# Patient Record
Sex: Male | Born: 2000 | Race: Black or African American | Hispanic: No | Marital: Single | Smoking: Never smoker
Health system: Southern US, Community
[De-identification: ages and names within clinical notes are randomized; demographics above are authoritative.]

## PROBLEM LIST (undated history)

## (undated) DIAGNOSIS — L709 Acne, unspecified: Secondary | ICD-10-CM

## (undated) DIAGNOSIS — J45990 Exercise induced bronchospasm: Secondary | ICD-10-CM

## (undated) HISTORY — PX: MOUTH SURGERY: SHX715

## (undated) HISTORY — DX: Acne, unspecified: L70.9

---
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
## 2019-07-13 ENCOUNTER — Ambulatory Visit: Payer: 59 | Attending: Internal Medicine

## 2019-07-13 DIAGNOSIS — Z23 Encounter for immunization: Secondary | ICD-10-CM

## 2019-07-13 NOTE — Progress Notes (Signed)
   Covid-19 Vaccination Clinic  Name:  Justin Stewart    MRN: [id number] DOB: 2000/05/15  07/13/2019  [name] was observed post Covid-19 immunization for 15 minutes without incident. He was provided with Vaccine Information Sheet and instruction to access the V-Safe system.   [name] was instructed to call 911 with any severe reactions post vaccine: Marland Kitchen Difficulty breathing  . Swelling of face and throat  . A fast heartbeat  . A bad rash all over body  . Dizziness and weakness   Immunizations Administered    Name Date Dose VIS Date Route   Pfizer COVID-19 Vaccine 07/13/2019 12:28 PM 0.3 mL 03/25/2019 Intramuscular   Manufacturer: [place]   Lot: [id number]   [place]: [id number]
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
## 2019-08-07 ENCOUNTER — Ambulatory Visit: Payer: 59 | Attending: Internal Medicine

## 2019-08-07 DIAGNOSIS — Z23 Encounter for immunization: Secondary | ICD-10-CM

## 2019-08-07 NOTE — Progress Notes (Signed)
   Covid-19 Vaccination Clinic  Name:  Justin Stewart    MRN: [id number] DOB: 04-29-2000  08/07/2019  [name] was observed post Covid-19 immunization for 15 minutes without incident. He was provided with Vaccine Information Sheet and instruction to access the V-Safe system.   [name] was instructed to call 911 with any severe reactions post vaccine: Marland Kitchen Difficulty breathing  . Swelling of face and throat  . A fast heartbeat  . A bad rash all over body  . Dizziness and weakness   Immunizations Administered    Name Date Dose VIS Date Route   Pfizer COVID-19 Vaccine 08/07/2019  9:28 AM 0.3 mL 06/08/2018 Intramuscular   Manufacturer: [employer]   Lot: [id number]   [place]: [id number]

## 2019-09-09 ENCOUNTER — Other Ambulatory Visit: Payer: Self-pay

## 2019-09-09 ENCOUNTER — Ambulatory Visit (INDEPENDENT_AMBULATORY_CARE_PROVIDER_SITE_OTHER): Payer: 59 | Admitting: Family Medicine

## 2019-09-09 ENCOUNTER — Ambulatory Visit: Payer: 59 | Admitting: Family Medicine

## 2019-09-09 ENCOUNTER — Encounter: Payer: Self-pay | Admitting: Family Medicine

## 2019-09-09 DIAGNOSIS — T7840XA Allergy, unspecified, initial encounter: Secondary | ICD-10-CM | POA: Diagnosis not present

## 2019-09-09 DIAGNOSIS — R21 Rash and other nonspecific skin eruption: Secondary | ICD-10-CM

## 2019-09-09 MED ORDER — PREDNISONE 20 MG PO TABS: ORAL_TABLET | ORAL | 0 refills | Status: DC

## 2019-09-09 NOTE — Progress Notes (Signed)
Chief Complaint  Patient presents with  . Allergic Reaction    ?Rx to Doxycycline-Stopped it 2 to 3 weeks but still has whelps  . Back Pain    Sharp pain once today while walking today    History of Present Illness: HPI    19 year old male presents with reaction to doxycycline for infected left stye.  Took doxy x 10 days.. noted rash like hive off and on.. started 1 week later.  Rash appears intermittently on side, arms and leg. Face occ puffy.   No tounge, no lip swelling, no SOB.   Benadryl helps with itching mainly.  He reports bath and body works soap and lotion.Marland Kitchen stop using this. No new foods lately.  COVID vaccine 19.. 07/2019    This visit occurred during the SARS-CoV-2 public health emergency.  Safety protocols were in place, including screening questions prior to the visit, additional usage of staff PPE, and extensive cleaning of exam room while observing appropriate contact time as indicated for disinfecting solutions.   COVID 19 screen:  No recent travel or known exposure to COVID19 The patient denies respiratory symptoms of COVID 19 at this time. The importance of social distancing was discussed today.     Review of Systems  Constitutional: Negative for chills and fever.  HENT: Negative for congestion and ear pain.   Eyes: Negative for pain and redness.  Respiratory: Negative for cough and shortness of breath.   Cardiovascular: Negative for chest pain, palpitations and leg swelling.  Gastrointestinal: Negative for abdominal pain, blood in stool, constipation, diarrhea, nausea and vomiting.  Genitourinary: Negative for dysuria.  Musculoskeletal: Negative for falls and myalgias.  Skin: Negative for rash.  Neurological: Negative for dizziness.  Psychiatric/Behavioral: Negative for depression. The patient is not nervous/anxious.       Past Medical History:  Diagnosis Date  . Acne     reports that he has never smoked. He has never used smokeless tobacco.  He reports that he does not drink alcohol or use drugs.  No current outpatient medications on file.   Observations/Objective: Blood pressure 90/60, pulse 63, temperature 98.4 F (36.9 C), temperature source Temporal, height 5\' 8"  (1.727 m), weight 153 lb 8 oz (69.6 kg), SpO2 96 %.  Physical Exam Constitutional:      Appearance: He is well-developed.  HENT:     Head: Normocephalic.     Right Ear: Hearing normal.     Left Ear: Hearing normal.     Nose: Nose normal.  Neck:     Thyroid: No thyroid mass or thyromegaly.     Vascular: No carotid bruit.     Trachea: Trachea normal.  Cardiovascular:     Rate and Rhythm: Normal rate and regular rhythm.     Pulses: Normal pulses.     Heart sounds: Heart sounds not distant. No murmur. No friction rub. No gallop.      Comments: No peripheral edema Pulmonary:     Effort: Pulmonary effort is normal. No respiratory distress.     Breath sounds: Normal breath sounds.  Skin:    General: Skin is warm and dry.     Findings: No rash.  Psychiatric:        Speech: Speech normal.        Behavior: Behavior normal.        Thought Content: Thought content normal.      Assessment and Plan   Allergic reaction Not clearly secondary to doxycycline. Unclear cause. Treat with prednisone  taper and antihistamine.  If continues to be recurrent consider keeping diary and allergy referral.     Eliezer Lofts, MD

## 2019-09-09 NOTE — Assessment & Plan Note (Signed)
Not clearly secondary to doxycycline. Unclear cause. Treat with prednisone taper and antihistamine.  If continues to be recurrent consider keeping diary and allergy referral.

## 2019-09-09 NOTE — Patient Instructions (Signed)
Start zyrtec at bedtime.  Start prednisone taper.  Go to ER if shortness of breath.  Call if not improvingin few weeks.

## 2020-05-02 ENCOUNTER — Other Ambulatory Visit: Payer: Self-pay | Admitting: Family Medicine

## 2020-05-02 ENCOUNTER — Other Ambulatory Visit (INDEPENDENT_AMBULATORY_CARE_PROVIDER_SITE_OTHER): Payer: 59

## 2020-05-02 ENCOUNTER — Encounter: Payer: Self-pay | Admitting: Family Medicine

## 2020-05-02 ENCOUNTER — Other Ambulatory Visit: Payer: Self-pay

## 2020-05-02 ENCOUNTER — Telehealth (INDEPENDENT_AMBULATORY_CARE_PROVIDER_SITE_OTHER): Payer: 59 | Admitting: Family Medicine

## 2020-05-02 DIAGNOSIS — J069 Acute upper respiratory infection, unspecified: Secondary | ICD-10-CM

## 2020-05-02 LAB — POC INFLUENZA A&B (BINAX/QUICKVUE)
Influenza A, POC: NEGATIVE
Influenza B, POC: NEGATIVE

## 2020-05-02 NOTE — Patient Instructions (Addendum)
Drink fluids and rest  The office will call you to set up swabs for covid and flu Nasal saline may help congestion  mucinex DM or robitussin dm for cough/congestion  Please continue to isolate until we get results   If worse please let us know  If severe or short of breath go to the ER

## 2020-05-02 NOTE — Progress Notes (Signed)
Virtual Visit via Video Note  I connected with Justin Stewart on 05/02/20 at 10:00 AM EST by a video enabled telemedicine application and verified that I am speaking with the correct person using two identifiers.  Location: Patient: home Provider: office   I discussed the limitations of evaluation and management by telemedicine and the availability of in person appointments. The patient expressed understanding and agreed to proceed.  Parties involved in encounter  Patient: Justin Stewart  Provider:  Loura Pardon MD   Video failed today  History of Present Illness  Pt presents for acute illness with n/v and congestion and ST 20 yo pt of Dr Justin Stewart  Lost his voice to start Sunday  Then prod cough -phlegm is green to clear  Vomited twice in one day (possibly from cough)  No diarrhea or abd pain  Yesterday had a fever (chills) -that improved overnight  Forgot the temp  Some nasal congestion   Some headache when he coughs (forehead)  Throat- hurt /now better  Ears =fine   No sick contacts  He is in school    Immunized for covid pfizer in April (no booster)  No flu shot  Had covid 8/20  Has not tested for covid during this illness   Otc:  Tylenol Alka selzer  Vit C packets  Drinking fluids   Patient Active Problem List   Diagnosis Date Noted  . Viral URI with cough 05/02/2020  . Allergic reaction 09/09/2019  . Other insomnia 08/09/2018  . Acute pain of left knee 08/09/2018  . 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 Acne    Past Surgical History:  Procedure Laterality Date  . MOUTH SURGERY     Social History   Tobacco Use  . Smoking status: Never Smoker  . Smokeless tobacco: Never Used  Vaping Use  . Vaping Use: Never used  Substance Use Topics  . Alcohol use: Never  . Drug use: Never   Family History  Problem Relation Age of Onset  . Hypertension Mother   . Iron deficiency Mother   . Deep vein thrombosis Father   .  Healthy Sister   . Healthy Brother   . Diabetes Maternal Grandfather   . Hypertension Maternal Grandfather   . Stroke Maternal Grandmother 60   No Known Allergies No current outpatient medications on file prior to visit.   No current facility-administered medications on file prior to visit.   Review of Systems  Constitutional: Positive for fever and malaise/fatigue. Negative for chills.  HENT: Positive for congestion. Negative for ear pain, sinus pain and sore throat.        ST is better  Eyes: Negative for blurred vision, discharge and redness.  Respiratory: Positive for cough and sputum production. Negative for shortness of breath, wheezing and stridor.   Cardiovascular: Negative for chest pain, palpitations and leg swelling.  Gastrointestinal: Negative for abdominal pain, diarrhea, nausea and vomiting.  Musculoskeletal: Negative for myalgias.  Skin: Negative for rash.  Neurological: Positive for headaches. Negative for dizziness.    Observations/Objective: Pt sounds hoarse Not distressed Talks slowly  Nl cognition Mood is nl  No cough during interview and does not sound sob  Assessment and Plan: Problem List Items Addressed This Visit      Respiratory   Viral URI with cough    Since Sunday in covid immunized pt (without booster)  Some fever and also vomiting one day  covid and flu swabs ordered for  drive through  Continue tylenol Fluids /avoid dehydration  Suggested nasal saline and mucinex dm or robitussin dm for cough/congestion ER parameters rev (sob, s/s of dehydration)  inst to isolate until results are back           Follow Up Instructions:  Drink fluids and rest  The office will call you to set up swabs for covid and flu Nasal saline may help congestion  mucinex DM or robitussin dm for cough/congestion  Please continue to isolate until we get results   If worse please let us know  If severe or short of breath go to the ER     I discussed the  assessment and treatment plan with the patient. The patient was provided an opportunity to ask questions and all were answered. The patient agreed with the plan and demonstrated an understanding of the instructions.   The patient was advised to call back or seek an in-person evaluation if the symptoms worsen or if the condition fails to improve as anticipated.  I provided 17 minutes of non-face-to-face time during this encounter.   Loura Pardon, MD

## 2020-05-02 NOTE — Assessment & Plan Note (Signed)
Since Sunday in covid immunized pt (without booster)  Some fever and also vomiting one day  covid and flu swabs ordered for drive through  Continue tylenol Fluids /avoid dehydration  Suggested nasal saline and mucinex dm or robitussin dm for cough/congestion ER parameters rev (sob, s/s of dehydration)  inst to isolate until results are back

## 2020-05-04 LAB — SPECIMEN STATUS REPORT

## 2020-05-04 LAB — SARS-COV-2, NAA 2 DAY TAT

## 2020-05-04 LAB — NOVEL CORONAVIRUS, NAA: SARS-CoV-2, NAA: NOT DETECTED

## 2020-05-07 ENCOUNTER — Telehealth: Payer: Self-pay

## 2020-05-07 ENCOUNTER — Other Ambulatory Visit: Payer: Self-pay | Admitting: Family Medicine

## 2020-05-07 ENCOUNTER — Other Ambulatory Visit (INDEPENDENT_AMBULATORY_CARE_PROVIDER_SITE_OTHER): Payer: 59

## 2020-05-07 DIAGNOSIS — J029 Acute pharyngitis, unspecified: Secondary | ICD-10-CM | POA: Diagnosis not present

## 2020-05-07 LAB — POCT RAPID STREP A (OFFICE): Rapid Strep A Screen: NEGATIVE

## 2020-05-07 NOTE — Telephone Encounter (Signed)
Justin Stewart's mother is calling in stating seen Dr.Tower last week. Mom states symptoms are not improving and covid and strep test were negative, wants to know if they can do another strep test along with a mono test?

## 2020-05-07 NOTE — Telephone Encounter (Signed)
Spoke with mother and pt's main sxs is his throat is even more sore then before, and feels swollen. Pt also saw white patches in the back of his throat this morning. Mother said she is concern maily for strep but wanted to be on the safe side and get him tested for mono also. Lab appt scheduled this afternoon, please put orders in

## 2020-05-07 NOTE — Telephone Encounter (Signed)
Tests ordered 

## 2020-05-07 NOTE — Telephone Encounter (Signed)
Sure What symptoms are worse/what are the same and what symptoms (if any) have improved?   Let me know and I will put orders in then

## 2020-05-07 NOTE — Telephone Encounter (Signed)
Will defer to Dr. Glori Bickers since she evaluated him.

## 2020-05-08 LAB — MONONUCLEOSIS SCREEN: Mono Screen: NEGATIVE

## 2020-05-09 ENCOUNTER — Other Ambulatory Visit: Payer: Self-pay

## 2020-05-09 ENCOUNTER — Encounter: Payer: Self-pay | Admitting: Family Medicine

## 2020-05-09 ENCOUNTER — Ambulatory Visit: Payer: 59 | Admitting: Family Medicine

## 2020-05-09 VITALS — BP 122/70 | HR 101 | Temp 99.9°F | Ht 68.0 in | Wt 160.0 lb

## 2020-05-09 DIAGNOSIS — J029 Acute pharyngitis, unspecified: Secondary | ICD-10-CM | POA: Diagnosis not present

## 2020-05-09 DIAGNOSIS — J069 Acute upper respiratory infection, unspecified: Secondary | ICD-10-CM | POA: Diagnosis not present

## 2020-05-09 MED ORDER — CEFTRIAXONE SODIUM 1 G IJ SOLR
1.0000 g | Freq: Once | INTRAMUSCULAR | Status: AC
  Administered 2020-05-09: 1 g via INTRAMUSCULAR

## 2020-05-09 MED ORDER — AMOXICILLIN-POT CLAVULANATE 875-125 MG PO TABS: 1.0000 | ORAL_TABLET | Freq: Two times a day (BID) | ORAL | 0 refills | Status: DC

## 2020-05-09 NOTE — Assessment & Plan Note (Addendum)
S/p viral syndrome -now primarily throat with tonsillitis Throat cx obt and well as swab for gonorrhea  Strep swab neg times 2 and mono test neg  Able to swallow with discomfort  Disc symptom control, nsaid/analgesic/fluids /ice  If unable to swallow or sob inst to go to ER Rest  Rocephin 1g given today  augmentin sent to pharmacy  Meds ordered this encounter  Medications  . amoxicillin-clavulanate (AUGMENTIN) 875-125 MG tablet    Sig: Take 1 tablet by mouth 2 (two) times daily.    Dispense:  14 tablet    Refill:  0  . cefTRIAXone (ROCEPHIN) injection 1 g   inst to call if no imp in several days

## 2020-05-09 NOTE — Progress Notes (Signed)
Subjective:    Patient ID: Justin Stewart, male    DOB: June 13, 2000, 20 y.o.   MRN: [id number]  This visit occurred during the SARS-CoV-2 public health emergency.  Safety protocols were in place, including screening questions prior to the visit, additional usage of staff PPE, and extensive cleaning of exam room while observing appropriate contact time as indicated for disinfecting solutions.    HPI  Pt presents with ST /white patches in throat   Wt Readings from Last 3 Encounters:  05/09/20 160 lb (72.6 kg)  09/09/19 153 lb 8 oz (69.6 kg)  11/25/18 150 lb (68 kg) (44 %, Z= -0.15)*   * Growth percentiles are based on CDC (Boys, 2-20 Years) data.   24.33 kg/m   Was seen 11/19 for uri symptoms Has had 2 neg covid tests 2 neg strep tests  Neg flu test  One neg mono test   Sore throat - painful to swallow  Some headache  Back hurts but thinks it was from bed  Fever -up and down   Drinking some ginger ale and some water  Some broth last night    Never smoker   Not a lot of nasal congestion /a little  Some cough here and there  Gagging but no vomiting  No diarrhea   No rash anywhere   No apthous ulcers that he knows of   Is sexually active  No exposure    otc ny quil  Salt water gargle   Patient Active Problem List   Diagnosis Date Noted  . Acute pharyngitis 05/09/2020  . Sore throat 05/07/2020  . Viral URI with cough 05/02/2020  . Allergic reaction 09/09/2019  . Other insomnia 08/09/2018  . Acute pain of left knee 08/09/2018  . 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 Acne    Past Surgical History:  Procedure Laterality Date  . MOUTH SURGERY     Social History   Tobacco Use  . Smoking status: Never Smoker  . Smokeless tobacco: Never Used  Vaping Use  . Vaping Use: Never used  Substance Use Topics  . Alcohol use: Never  . Drug use: Never   Family History  Problem Relation Age of Onset  . Hypertension Mother    . Iron deficiency Mother   . Deep vein thrombosis Father   . Healthy Sister   . Healthy Brother   . Diabetes Maternal Grandfather   . Hypertension Maternal Grandfather   . Stroke Maternal Grandmother 60   No Known Allergies No current outpatient medications on file prior to visit.   No current facility-administered medications on file prior to visit.    Review of Systems  Constitutional: Positive for fatigue and fever. Negative for activity change, appetite change and unexpected weight change.  HENT: Positive for postnasal drip, rhinorrhea, sore throat, trouble swallowing and voice change. Negative for congestion and sinus pain.   Eyes: Negative for pain, redness, itching and visual disturbance.  Respiratory: Positive for cough. Negative for chest tightness, shortness of breath and wheezing.   Cardiovascular: Negative for chest pain and palpitations.  Gastrointestinal: Negative for abdominal pain, blood in stool, constipation, diarrhea and nausea.  Endocrine: Negative for cold intolerance, heat intolerance, polydipsia and polyuria.  Genitourinary: Negative for difficulty urinating, dysuria, frequency and urgency.  Musculoskeletal: Negative for arthralgias, joint swelling and myalgias.  Skin: Negative for pallor and rash.  Neurological: Positive for headaches. Negative for dizziness, tremors, weakness and numbness.  Hematological: Negative  for adenopathy. Does not bruise/bleed easily.  Psychiatric/Behavioral: Negative for decreased concentration and dysphoric mood. The patient is not nervous/anxious.        Objective:   Physical Exam Constitutional:      General: He is not in acute distress.    Appearance: Normal appearance. He is normal weight. He is not ill-appearing.     Comments: fatigued  HENT:     Head: Normocephalic and atraumatic.     Right Ear: Tympanic membrane, ear canal and external ear normal.     Left Ear: Tympanic membrane, ear canal and external ear normal.      Nose: Nose normal.     Mouth/Throat:     Mouth: Mucous membranes are moist.     Pharynx: Oropharyngeal exudate and posterior oropharyngeal erythema present.     Comments: Moderately swollen tonsils with erythema and exudate  No ulcers  No evidence of thrush Eyes:     General:        Right eye: No discharge.        Left eye: No discharge.     Conjunctiva/sclera: Conjunctivae normal.     Pupils: Pupils are equal, round, and reactive to light.  Cardiovascular:     Rate and Rhythm: Regular rhythm. Tachycardia present.     Heart sounds: Normal heart sounds.  Pulmonary:     Effort: Pulmonary effort is normal. No respiratory distress.     Breath sounds: Normal breath sounds. No wheezing or rales.  Musculoskeletal:     Cervical back: Normal range of motion and neck supple.  Lymphadenopathy:     Cervical: No cervical adenopathy.  Skin:    Findings: No erythema or rash.  Neurological:     Mental Status: He is alert.  Psychiatric:        Mood and Affect: Mood normal.           Assessment & Plan:   Problem List Items Addressed This Visit      Respiratory   Viral URI with cough    2 covid tests neg and flu test neg  For sore throat- neg strep swab times 2 and neg mono      Relevant Orders   Other/Misc lab test   Acute pharyngitis - Primary    S/p viral syndrome -now primarily throat with tonsillitis Throat cx obt and well as swab for gonorrhea  Strep swab neg times 2 and mono test neg  Able to swallow with discomfort  Disc symptom control, nsaid/analgesic/fluids /ice  If unable to swallow or sob inst to go to ER Rest  Rocephin 1g given today  augmentin sent to pharmacy  Meds ordered this encounter  Medications  . amoxicillin-clavulanate (AUGMENTIN) 875-125 MG tablet    Sig: Take 1 tablet by mouth 2 (two) times daily.    Dispense:  14 tablet    Refill:  0  . cefTRIAXone (ROCEPHIN) injection 1 g   inst to call if no imp in several days      Relevant Orders    Other/Misc lab test   Culture, Group A Strep     Other   Sore throat   Relevant Orders   Other/Misc lab test

## 2020-05-09 NOTE — Assessment & Plan Note (Signed)
2 covid tests neg and flu test neg  For sore throat- neg strep swab times 2 and neg mono

## 2020-05-09 NOTE — Patient Instructions (Signed)
Sip fluids  Ice/popcicles are good also  If unable to swallow or trouble breathing please alert [place] and go to the ER   Rocephin shot today  Then start augmentin tomorrow twice daily   Tylenol and /or ibuprofen for pain or fever

## 2020-05-14 LAB — CULTURE, GROUP A STREP
MICRO NUMBER:: 11459844
SPECIMEN QUALITY:: ADEQUATE

## 2020-05-14 LAB — NEISSERIA GONORRHOEAE RNA, TMA, THROAT: Neisseria gonorrhoeae RNA: NOT DETECTED

## 2020-05-15 ENCOUNTER — Telehealth: Payer: Self-pay | Admitting: Family Medicine

## 2020-05-15 NOTE — Telephone Encounter (Signed)
Pt notified of Dr. Tower's comments and verbalized understanding  

## 2020-05-15 NOTE — Telephone Encounter (Signed)
Note done If he fails to improve further or symptoms re occur I recommend follow up with his primary provider and from there ENT if needed.  I do not suspect he will need a tonsillectomy at this point.

## 2020-05-15 NOTE — Telephone Encounter (Signed)
Patient called stating he received a call from [place]. He is returning the call to [place]. EM

## 2020-05-15 NOTE — Telephone Encounter (Signed)
Pt notified of throat cx results. Pt said that the abx is helping, his ST is getting better. Pt needs a school note, he has been out from 05/07/20 and wants to return after finishing abx so his return date needs to be this Friday 05/18/20. Pt said if Dr. Glori Bickers put the school note on mychart he can just email it from his phone.   Pt also asked if he needs to get his tonsils removed if this happens again?

## 2020-06-13 ENCOUNTER — Telehealth: Payer: Self-pay

## 2020-06-13 NOTE — Telephone Encounter (Signed)
As new sore throat would recommend virtual visit. Anticipate he will need drive up testing for covid as well as consideration for additional testing.

## 2020-06-13 NOTE — Telephone Encounter (Signed)
Per Cardell Peach, RN, pt's father came into office inquiring if he could get an apt for the pt for tomorrow. They informed him the pt had an apt already for tomorrow and that it was a VV. He said that the pt needed to be seen and how is going to be assessed with a virtual visit. He then left the office. Shortly after he came back into the office and spoke with another front office staff member and verbalized he was upset that pt's apt tomorrow was  a VV when he was seen in the office for the same issue at the end of Jan 2022.  He was told by front office staff that it is protocol with COVID symptoms to have a VV. The pt's father was upset and walked out. He voiced his frustration but per the front office staff pt was not rude to any of them, just frustrated.   Management was not advised about the pt while he was in the office.

## 2020-06-13 NOTE — Telephone Encounter (Signed)
Spoke with patient and changed to a virtual appointment.

## 2020-06-13 NOTE — Telephone Encounter (Signed)
Patient called in a stated that about a month ago he was seen in office with Dr. Glori Bickers and was diagnosed with tonsillitis. Patient stated that after taking the antibiotics this improved, but his symptoms (sore throat) have returned. Patient denied SOB, chest pain, nasal congestion, cough, fever, N/V/D, loss of taste or smell. Patient has an appointment tomorrow with Dr. Einar Pheasant at 3:40. Instructed patient if he develops new or worsening symptoms to call back. UC and ED precautions given and patient verbalized understanding. Ok to see in office? Please advise.

## 2020-06-13 NOTE — Telephone Encounter (Signed)
Noted  

## 2020-06-14 ENCOUNTER — Other Ambulatory Visit: Payer: Self-pay

## 2020-06-14 ENCOUNTER — Other Ambulatory Visit (INDEPENDENT_AMBULATORY_CARE_PROVIDER_SITE_OTHER): Payer: 59 | Admitting: Family Medicine

## 2020-06-14 ENCOUNTER — Encounter: Payer: Self-pay | Admitting: Family Medicine

## 2020-06-14 ENCOUNTER — Telehealth (INDEPENDENT_AMBULATORY_CARE_PROVIDER_SITE_OTHER): Payer: 59 | Admitting: Family Medicine

## 2020-06-14 ENCOUNTER — Other Ambulatory Visit: Payer: 59

## 2020-06-14 VITALS — Wt 152.0 lb

## 2020-06-14 DIAGNOSIS — J039 Acute tonsillitis, unspecified: Secondary | ICD-10-CM | POA: Insufficient documentation

## 2020-06-14 DIAGNOSIS — J029 Acute pharyngitis, unspecified: Secondary | ICD-10-CM

## 2020-06-14 DIAGNOSIS — R21 Rash and other nonspecific skin eruption: Secondary | ICD-10-CM | POA: Insufficient documentation

## 2020-06-14 DIAGNOSIS — L309 Dermatitis, unspecified: Secondary | ICD-10-CM

## 2020-06-14 LAB — POCT RAPID STREP A (OFFICE): Rapid Strep A Screen: NEGATIVE

## 2020-06-14 MED ORDER — TRIAMCINOLONE ACETONIDE 0.1 % EX CREA: 1.0000 "application " | TOPICAL_CREAM | Freq: Two times a day (BID) | CUTANEOUS | 0 refills | Status: DC

## 2020-06-14 MED ORDER — AMOXICILLIN-POT CLAVULANATE 875-125 MG PO TABS: 1.0000 | ORAL_TABLET | Freq: Two times a day (BID) | ORAL | 0 refills | Status: DC

## 2020-06-14 NOTE — Assessment & Plan Note (Signed)
Most consistent with eczema. Trial of triamcinolone cream.

## 2020-06-14 NOTE — Assessment & Plan Note (Signed)
Reviewed prior work-up which was negative for rapid strept and culture but eventually responded to antibiotics. He notes recent covid-19 infection so will not test for this. Given hx and similar presentation will treat with abx but have him do rapid strept today. Discussed if symptoms recur that we will plan for ENT consult

## 2020-06-14 NOTE — Progress Notes (Signed)
I connected with Justin Stewart on 06/14/20 at  3:40 PM EST by video and verified that I am speaking with the correct person using two identifiers.   I discussed the limitations, risks, security and privacy concerns of performing an evaluation and management service by video and the availability of in person appointments. I also discussed with the patient that there may be a patient responsible charge related to this service. The patient expressed understanding and agreed to proceed.  Patient location: Home Provider Location: [place] Participants: [name] and Justin Stewart   Subjective:     Justin Stewart is a 20 y.o. male presenting for Sore Throat (With a puss pocket X 4 days)     HPI   #Sore Throat - symptoms started 4 days ago - friend was sick on Saturday  - symptoms start 06/11/2020 - no loss of taste or smell - hx of covid infection in 11/2018  Reports positive covid test within the 90 days  #Neck eczema  - using moisturizer - occasionally itchy - worse with heat from the shower - redness and stinging - started 2 weeks ago - started with one sp   Review of Systems  Constitutional: Negative for chills and fever.  HENT: Positive for rhinorrhea (on tuesday) and sore throat. Negative for congestion, ear discharge, ear pain and sneezing.   Respiratory: Negative for cough and shortness of breath.   Musculoskeletal: Positive for back pain. Negative for arthralgias and myalgias.  Neurological: Negative for headaches.    Review of Systems   Social History   Tobacco Use  Smoking Status Never Smoker  Smokeless Tobacco Never Used        Objective:   BP Readings from Last 3 Encounters:  05/09/20 122/70  09/09/19 90/60  11/25/18 92/60   Wt Readings from Last 3 Encounters:  06/14/20 152 lb (68.9 kg)  05/09/20 160 lb (72.6 kg)  09/09/19 153 lb 8 oz (69.6 kg)   Wt 152 lb (68.9 kg)   BMI 23.11 kg/m    Physical Exam Constitutional:       Appearance: Normal appearance. He is not ill-appearing.  HENT:     Head: Normocephalic and atraumatic.     Right Ear: External ear normal.     Left Ear: External ear normal.     Mouth/Throat:     Mouth: Oral lesions (right side ulcerative lesion) present.     Pharynx: Posterior oropharyngeal erythema present.     Tonsils: 2+ on the right. 2+ on the left.  Eyes:     Conjunctiva/sclera: Conjunctivae normal.  Pulmonary:     Effort: Pulmonary effort is normal. No respiratory distress.  Skin:    Comments: Posterior neck with papular and plaque erythematous patches.   Neurological:     Mental Status: He is alert. Mental status is at baseline.  Psychiatric:        Mood and Affect: Mood normal.        Behavior: Behavior normal.        Thought Content: Thought content normal.        Judgment: Judgment normal.             Assessment & Plan:   Problem List Items Addressed This Visit      Respiratory   Tonsillitis - Primary    Reviewed prior work-up which was negative for rapid strept and culture but eventually responded to antibiotics. He notes recent covid-19 infection so will not test for this.  Given hx and similar presentation will treat with abx but have him do rapid strept today. Discussed if symptoms recur that we will plan for ENT consult      Relevant Medications   amoxicillin-clavulanate (AUGMENTIN) 875-125 MG tablet     Musculoskeletal and Integument   Rash    Most consistent with eczema. Trial of triamcinolone cream.       Relevant Medications   triamcinolone (KENALOG) 0.1 %       Return if symptoms worsen or fail to improve.  [name], MD

## 2020-06-18 ENCOUNTER — Ambulatory Visit: Payer: 59 | Admitting: Family Medicine

## 2020-06-18 ENCOUNTER — Other Ambulatory Visit: Payer: Self-pay | Admitting: Family Medicine

## 2020-06-18 DIAGNOSIS — J039 Acute tonsillitis, unspecified: Secondary | ICD-10-CM

## 2020-06-18 NOTE — Progress Notes (Signed)
Spoke with pt's father this morning  Justin Stewart's symptoms of sore throat are worsening  2nd episode in ~1 month Previous work-up negative but responded to abx  On antibiotics with worsening symptoms. Could be viral, however, will refer to ENT for further evaluation given persistent symptoms

## 2020-06-18 NOTE — Addendum Note (Signed)
Addended by: Ellamae Sia on: 06/18/2020 04:27 PM   Modules accepted: Orders

## 2020-06-19 ENCOUNTER — Telehealth: Payer: Self-pay | Admitting: Family Medicine

## 2020-06-19 NOTE — Telephone Encounter (Signed)
Pt called in wanted to let Dr.Cody know that the medication is not working and his throat is still sore.

## 2020-06-19 NOTE — Telephone Encounter (Signed)
Spoke to pt and let him know that Dr. Einar Pheasant has sent in a referral to ENT. If pt wishes to be seen before getting in with ENT he can come into office since he has recently tested negative for covid and strep.

## 2020-06-21 ENCOUNTER — Encounter: Payer: Self-pay | Admitting: Family Medicine

## 2020-06-21 ENCOUNTER — Other Ambulatory Visit: Payer: Self-pay

## 2020-06-21 ENCOUNTER — Ambulatory Visit: Payer: 59 | Admitting: Family Medicine

## 2020-06-21 VITALS — BP 100/62 | HR 78 | Temp 98.8°F | Ht 68.0 in | Wt 164.2 lb

## 2020-06-21 DIAGNOSIS — J039 Acute tonsillitis, unspecified: Secondary | ICD-10-CM | POA: Diagnosis not present

## 2020-06-21 DIAGNOSIS — J36 Peritonsillar abscess: Secondary | ICD-10-CM | POA: Diagnosis not present

## 2020-06-21 MED ORDER — AMOXICILLIN-POT CLAVULANATE 875-125 MG PO TABS: 1.0000 | ORAL_TABLET | Freq: Two times a day (BID) | ORAL | 0 refills | Status: AC

## 2020-06-21 NOTE — Progress Notes (Signed)
Subjective:     Justin Stewart is a 20 y.o. male presenting for Sore Throat (Swelling and red )     HPI   #Sore throat - on last day of antibiotics - the initial pus pocket improved - now with new pus pockets and tonsil swellings - pain with swallowing - did have a fever Sunday-Tuesday    Review of Systems  HENT: Positive for sore throat. Negative for rhinorrhea.   Respiratory: Negative for cough and shortness of breath.      Social History   Tobacco Use  Smoking Status Never Smoker  Smokeless Tobacco Never Used        Objective:    BP Readings from Last 3 Encounters:  06/21/20 100/62  05/09/20 122/70  09/09/19 90/60   Wt Readings from Last 3 Encounters:  06/21/20 164 lb 4 oz (74.5 kg)  06/14/20 152 lb (68.9 kg)  05/09/20 160 lb (72.6 kg)    BP 100/62   Pulse 78   Temp 98.8 F (37.1 C) (Temporal)   Ht 5\' 8"  (1.727 m)   Wt 164 lb 4 oz (74.5 kg)   SpO2 98%   BMI 24.97 kg/m    Physical Exam Constitutional:      Appearance: Normal appearance. He is not ill-appearing or diaphoretic.  HENT:     Head: Normocephalic and atraumatic.     Right Ear: External ear normal.     Left Ear: External ear normal.     Nose: Nose normal.     Mouth/Throat:     Mouth: Mucous membranes are moist.     Tonsils: Tonsillar exudate present. 3+ on the right. 3+ on the left.     Comments: Uvula is deviate to the right. Right tonsil is swollen and forward.  Eyes:     General: No scleral icterus.    Extraocular Movements: Extraocular movements intact.     Conjunctiva/sclera: Conjunctivae normal.  Neck:     Comments: Tenderness and swelling along the right side the neck Cardiovascular:     Rate and Rhythm: Normal rate.  Pulmonary:     Effort: Pulmonary effort is normal.  Musculoskeletal:     Cervical back: Neck supple.  Lymphadenopathy:     Cervical: Cervical adenopathy present.  Skin:    General: Skin is warm and dry.  Neurological:     Mental Status: He is alert.  Mental status is at baseline.  Psychiatric:        Mood and Affect: Mood normal.           Assessment & Plan:   Problem List Items Addressed This Visit      Respiratory   Tonsillitis   Relevant Medications   amoxicillin-clavulanate (AUGMENTIN) 875-125 MG tablet   Peritonsillar abscess - Primary    Worsening sore throat and swelling after 1 week of abx with fevers which has resolved. Discussed with Dr. Pryor Ochoa concern for abscess and he agreed to evaluatethis afternoon. Pt advised to go Dr. Darien Ramus office for further treatment. Currently afebrile and tolerating PO so do not think ER is necessary with close ENT evaluation.        Relevant Medications   amoxicillin-clavulanate (AUGMENTIN) 875-125 MG tablet       Return if symptoms worsen or fail to improve.  [name], MD  This visit occurred during the SARS-CoV-2 public health emergency.  Safety protocols were in place, including screening questions prior to the visit, additional usage of staff PPE, and extensive cleaning of  exam room while observing appropriate contact time as indicated for disinfecting solutions.

## 2020-06-21 NOTE — Patient Instructions (Addendum)
Go to [facility] [address] [place], [place], [place] [address]

## 2020-06-21 NOTE — Assessment & Plan Note (Addendum)
Worsening sore throat and swelling after 1 week of abx with fevers which has resolved. Discussed with Dr. Pryor Ochoa concern for abscess and he agreed to evaluatethis afternoon. Pt advised to go Dr. Darien Ramus office for further treatment. Currently afebrile and tolerating PO so do not think ER is necessary with close ENT evaluation.

## 2021-07-11 ENCOUNTER — Encounter: Payer: 59 | Admitting: Family Medicine

## 2021-07-11 ENCOUNTER — Encounter: Payer: Self-pay | Admitting: Family Medicine

## 2021-07-11 ENCOUNTER — Ambulatory Visit (INDEPENDENT_AMBULATORY_CARE_PROVIDER_SITE_OTHER): Payer: 59 | Admitting: Family Medicine

## 2021-07-11 VITALS — BP 120/84 | HR 76 | Ht 67.0 in | Wt 185.0 lb

## 2021-07-11 DIAGNOSIS — Z23 Encounter for immunization: Secondary | ICD-10-CM | POA: Diagnosis not present

## 2021-07-11 DIAGNOSIS — M79671 Pain in right foot: Secondary | ICD-10-CM

## 2021-07-11 DIAGNOSIS — G8929 Other chronic pain: Secondary | ICD-10-CM

## 2021-07-11 DIAGNOSIS — M25562 Pain in left knee: Secondary | ICD-10-CM | POA: Diagnosis not present

## 2021-07-11 DIAGNOSIS — D2330 Other benign neoplasm of skin of unspecified part of face: Secondary | ICD-10-CM

## 2021-07-11 DIAGNOSIS — Z0000 Encounter for general adult medical examination without abnormal findings: Secondary | ICD-10-CM | POA: Diagnosis not present
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
## 2021-07-11 NOTE — Patient Instructions (Signed)
Call or Mychart if you decide you want a referral locally ? ?
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
## 2021-07-12 ENCOUNTER — Encounter: Payer: Self-pay | Admitting: *Deleted

## 2021-08-21 ENCOUNTER — Ambulatory Visit (INDEPENDENT_AMBULATORY_CARE_PROVIDER_SITE_OTHER): Payer: 59 | Admitting: Family Medicine

## 2021-08-21 VITALS — BP 100/60 | HR 59 | Temp 98.4°F | Wt 183.2 lb

## 2021-08-21 DIAGNOSIS — J029 Acute pharyngitis, unspecified: Secondary | ICD-10-CM | POA: Diagnosis not present

## 2021-08-21 NOTE — Progress Notes (Signed)
? ?Subjective:  ? ?  ?Justin Stewart is a 21 y.o. male presenting for Nasal Congestion, Cough, Sore Throat (Was sore for 2 days but has gotten better ), and Fever (Broke on Monday ) ?  ? ? ?Sore Throat  ?This is a new problem. Episode onset: 08/17/2021. The problem has been gradually improving. Neither side of throat is experiencing more pain than the other. The pain is mild. Associated symptoms include congestion, coughing and headaches. Pertinent negatives include no diarrhea, ear pain, plugged ear sensation, shortness of breath or vomiting. He has had exposure to strep. Treatments tried: rest.  ?Fever  ?Associated symptoms include congestion, coughing, headaches and a sore throat. Pertinent negatives include no diarrhea, ear pain, nausea or vomiting.  ? ?Negative covid test ? ?Review of Systems  ?Constitutional:  Positive for chills, diaphoresis and fever.  ?HENT:  Positive for congestion, postnasal drip, rhinorrhea, sneezing and sore throat. Negative for ear pain and sinus pressure.   ?Respiratory:  Positive for cough. Negative for shortness of breath.   ?Gastrointestinal:  Negative for diarrhea, nausea and vomiting.  ?Musculoskeletal:  Negative for arthralgias and myalgias.  ?Neurological:  Positive for headaches.  ? ? ?Social History  ? ?Tobacco Use  ?Smoking Status Never  ?Smokeless Tobacco Never  ? ? ? ?   ?Objective:  ?  ?BP Readings from Last 3 Encounters:  ?08/21/21 100/60  ?07/11/21 120/84  ?06/21/20 100/62  ? ?Wt Readings from Last 3 Encounters:  ?08/21/21 183 lb 4 oz (83.1 kg)  ?07/11/21 185 lb (83.9 kg)  ?06/21/20 164 lb 4 oz (74.5 kg)  ? ? ?BP 100/60   Pulse (!) 59   Temp 98.4 ?F (36.9 ?C) (Temporal)   Wt 183 lb 4 oz (83.1 kg)   SpO2 97%   BMI 28.70 kg/m?  ? ? ?Physical Exam ?Constitutional:   ?   General: He is not in acute distress. ?   Appearance: He is well-developed. He is not ill-appearing.  ?HENT:  ?   Head: Normocephalic and atraumatic.  ?   Right Ear: Tympanic membrane and ear canal normal.   ?   Left Ear: Tympanic membrane and ear canal normal.  ?   Nose: Mucosal edema and rhinorrhea present.  ?   Right Sinus: No maxillary sinus tenderness or frontal sinus tenderness.  ?   Left Sinus: No maxillary sinus tenderness or frontal sinus tenderness.  ?   Mouth/Throat:  ?   Pharynx: Uvula midline. Posterior oropharyngeal erythema present. No oropharyngeal exudate.  ?   Tonsils: No tonsillar exudate. 2+ on the right. 2+ on the left.  ?Eyes:  ?   Conjunctiva/sclera: Conjunctivae normal.  ?   Pupils: Pupils are equal, round, and reactive to light.  ?Cardiovascular:  ?   Rate and Rhythm: Normal rate and regular rhythm.  ?   Heart sounds: No murmur heard. ?Pulmonary:  ?   Effort: Pulmonary effort is normal. No respiratory distress.  ?   Breath sounds: Normal breath sounds.  ?Musculoskeletal:  ?   Cervical back: Neck supple.  ?Lymphadenopathy:  ?   Cervical: No cervical adenopathy.  ?Skin: ?   General: Skin is warm and dry.  ?   Capillary Refill: Capillary refill takes less than 2 seconds.  ?Neurological:  ?   Mental Status: He is alert.  ? ?Negative strept testing ? ? ? ?   ?Assessment & Plan:  ? ?Problem List Items Addressed This Visit   ?None ?Visit Diagnoses   ? ? Sore  throat    -  Primary  ? ?  ? ?Negative strep testing, and symptoms improving.  Discussed this is likely a viral syndrome that will resolve on its own.  Discussed warning signs for sinus infection and patient will call back if present for consideration for antibiotics at that time.  Continue symptomatic care. ? ? ?Return if symptoms worsen or fail to improve. ? ?[name], MD ? ? ? ?

## 2021-08-21 NOTE — Patient Instructions (Signed)
Based on your symptoms, it looks like you have a virus.   Antibiotics are not need for a viral infection but the following will help:   Drink plenty of fluids Get lots of rest  Sinus Congestion 1) Neti Pot (Saline rinse) -- 2 times day -- if tolerated 2) Flonase (Store Brand ok) - once daily 3) Over the counter congestion medications  Cough 1) Cough drops can be helpful 2) Nyquil (or nighttime cough medication) 3) Honey is proven to be one of the best cough medications  4) Cough medicine with Dextromethorphan can also be helpful  Sore Throat 1) Honey as above, cough drops 2) Ibuprofen or Aleve can be helpful 3) Salt water Gargles  If you develop fevers (Temperature >100.4), chills, worsening symptoms or symptoms lasting longer than 10 days return to clinic.    

## 2021-09-03 ENCOUNTER — Ambulatory Visit (INDEPENDENT_AMBULATORY_CARE_PROVIDER_SITE_OTHER): Payer: 59

## 2021-09-03 DIAGNOSIS — Z23 Encounter for immunization: Secondary | ICD-10-CM | POA: Diagnosis not present

## 2021-10-19 ENCOUNTER — Encounter (HOSPITAL_COMMUNITY): Payer: Self-pay

## 2021-10-19 ENCOUNTER — Emergency Department (HOSPITAL_COMMUNITY)
Admission: EM | Admit: 2021-10-19 | Discharge: 2021-10-19 | Disposition: A | Discharge status: Home / Self Care | Payer: 59 | Attending: Emergency Medicine | Admitting: Emergency Medicine

## 2021-10-19 ENCOUNTER — Other Ambulatory Visit: Payer: Self-pay

## 2021-10-19 ENCOUNTER — Emergency Department (HOSPITAL_COMMUNITY): Payer: 59

## 2021-10-19 DIAGNOSIS — J039 Acute tonsillitis, unspecified: Secondary | ICD-10-CM | POA: Diagnosis not present

## 2021-10-19 DIAGNOSIS — J029 Acute pharyngitis, unspecified: Secondary | ICD-10-CM | POA: Diagnosis present

## 2021-10-19 LAB — CBC WITH DIFFERENTIAL/PLATELET
Abs Immature Granulocytes: 0 10*3/uL (ref 0.00–0.07)
Basophils Absolute: 0.1 10*3/uL (ref 0.0–0.1)
Basophils Relative: 1 %
Eosinophils Absolute: 0 10*3/uL (ref 0.0–0.5)
Eosinophils Relative: 0 %
HCT: 47.2 % (ref 39.0–52.0)
Hemoglobin: 16.2 g/dL (ref 13.0–17.0)
Lymphocytes Relative: 35 %
Lymphs Abs: 4.2 10*3/uL — ABNORMAL HIGH (ref 0.7–4.0)
MCH: 29.9 pg (ref 26.0–34.0)
MCHC: 34.3 g/dL (ref 30.0–36.0)
MCV: 87.1 fL (ref 80.0–100.0)
Monocytes Absolute: 0.4 10*3/uL (ref 0.1–1.0)
Monocytes Relative: 3 %
Neutro Abs: 7.3 10*3/uL (ref 1.7–7.7)
Neutrophils Relative %: 61 %
Platelets: 250 10*3/uL (ref 150–400)
RBC: 5.42 MIL/uL (ref 4.22–5.81)
RDW: 12.3 % (ref 11.5–15.5)
WBC: 11.9 10*3/uL — ABNORMAL HIGH (ref 4.0–10.5)
nRBC: 0 % (ref 0.0–0.2)

## 2021-10-19 LAB — COMPREHENSIVE METABOLIC PANEL
ALT: 24 U/L (ref 0–44)
AST: 26 U/L (ref 15–41)
Albumin: 4.6 g/dL (ref 3.5–5.0)
Alkaline Phosphatase: 138 U/L — ABNORMAL HIGH (ref 38–126)
Anion gap: 11 (ref 5–15)
BUN: 13 mg/dL (ref 6–20)
CO2: 25 mmol/L (ref 22–32)
Calcium: 9.7 mg/dL (ref 8.9–10.3)
Chloride: 102 mmol/L (ref 98–111)
Creatinine, Ser: 1.11 mg/dL (ref 0.61–1.24)
GFR, Estimated: >60 mL/min (ref >60)
Glucose, Bld: 90 mg/dL (ref 70–99)
Potassium: 3.7 mmol/L (ref 3.5–5.1)
Sodium: 138 mmol/L (ref 135–145)
Total Bilirubin: 1.1 mg/dL (ref 0.3–1.2)
Total Protein: 9 g/dL — ABNORMAL HIGH (ref 6.5–8.1)

## 2021-10-19 LAB — URINALYSIS, ROUTINE W REFLEX MICROSCOPIC
Bilirubin Urine: NEGATIVE
Glucose, UA: NEGATIVE mg/dL
Hgb urine dipstick: NEGATIVE
Ketones, ur: 80 mg/dL — AB
Leukocytes,Ua: NEGATIVE
Nitrite: NEGATIVE
Protein, ur: NEGATIVE mg/dL
Specific Gravity, Urine: 1.044 — ABNORMAL HIGH (ref 1.005–1.030)
pH: 6 (ref 5.0–8.0)

## 2021-10-19 LAB — LACTIC ACID, PLASMA: Lactic Acid, Venous: 1.2 mmol/L (ref 0.5–1.9)

## 2021-10-19 LAB — GROUP A STREP BY PCR: Group A Strep by PCR: NOT DETECTED

## 2021-10-19 MED ORDER — LACTATED RINGERS IV BOLUS
1000.0000 mL | Freq: Once | INTRAVENOUS | Status: AC
  Administered 2021-10-19: 1000 mL via INTRAVENOUS

## 2021-10-19 MED ORDER — IOHEXOL 300 MG/ML  SOLN
75.0000 mL | Freq: Once | INTRAMUSCULAR | Status: AC | PRN
  Administered 2021-10-19: 75 mL via INTRAVENOUS

## 2021-10-19 MED ORDER — PREDNISONE 10 MG PO TABS: 40.0000 mg | ORAL_TABLET | Freq: Every day | ORAL | 0 refills | Status: AC

## 2021-10-19 MED ORDER — KETOROLAC TROMETHAMINE 15 MG/ML IJ SOLN
15.0000 mg | Freq: Once | INTRAMUSCULAR | Status: AC
Start: 2021-10-19 — End: 2021-10-19
  Administered 2021-10-19: 15 mg via INTRAVENOUS
  Filled 2021-10-19: qty 1

## 2021-10-19 MED ORDER — DEXAMETHASONE SODIUM PHOSPHATE 10 MG/ML IJ SOLN
10.0000 mg | Freq: Once | INTRAMUSCULAR | Status: AC
  Administered 2021-10-19: 10 mg via INTRAVENOUS
  Filled 2021-10-19: qty 1

## 2021-10-19 MED ORDER — SODIUM CHLORIDE (PF) 0.9 % IJ SOLN
INTRAMUSCULAR | Status: AC
  Filled 2021-10-19: qty 50

## 2021-10-19 MED ORDER — CLINDAMYCIN HCL 150 MG PO CAPS: 450.0000 mg | ORAL_CAPSULE | Freq: Three times a day (TID) | ORAL | 0 refills | Status: AC

## 2021-10-19 MED ORDER — SODIUM CHLORIDE 0.9 % IV SOLN
3.0000 g | Freq: Once | INTRAVENOUS | Status: AC
  Administered 2021-10-19: 3 g via INTRAVENOUS
  Filled 2021-10-19: qty 8

## 2021-10-19 NOTE — ED Provider Notes (Signed)
[facility] DEPT Provider Note   CSN: [id number] Arrival date & time: 10/19/21  8315     History  Chief Complaint  Patient presents with   Sore Throat    Justin Stewart is a 21 y.o. male.   Sore Throat Associated symptoms include headaches.  Patient presents for sore throat.  Medical history includes chronic knee pain and depression.  He had tonsillitis 1 year ago and was seen by ENT at that time.  3 days ago, he developed symptoms of sore throat.  He was seen in urgent care and prescribed penicillin VK.  He has been taking this medication.  Despite this, he has developed persistent sore throat, increasing swelling which has caused changes in his voice and difficulty swallowing.  He states that he has recently only been able to swallow fluids.  In addition of sore throat, patient endorses a mild headache.     Home Medications Prior to Admission medications   Medication Sig Start Date End Date Taking? Authorizing Provider  acetaminophen (TYLENOL) 500 MG tablet Take 1,000 mg by mouth every 4 (four) hours as needed (pain).   Yes [provider]  clindamycin (CLEOCIN) 150 MG capsule Take 3 capsules (450 mg total) by mouth 3 (three) times daily for 7 days. 10/19/21 10/26/21 Yes Godfrey Pick, MD  ibuprofen (ADVIL) 200 MG tablet Take 800 mg by mouth every 8 (eight) hours as needed (pain).   Yes [provider]  penicillin v potassium (VEETID) 500 MG tablet Take 500 mg by mouth 3 (three) times daily. For 10 days 10/16/21  Yes [provider]  predniSONE (DELTASONE) 10 MG tablet Take 4 tablets (40 mg total) by mouth daily for 4 days. 10/19/21 10/23/21 Yes Godfrey Pick, MD      Allergies    Patient has no known allergies.    Review of Systems   Review of Systems  Constitutional:  Positive for activity change and appetite change.  HENT:  Positive for sore throat, trouble swallowing and voice change.   Neurological:  Positive for headaches.  All  other systems reviewed and are negative.   Physical Exam Updated Vital Signs BP 132/82 (BP Location: Left Arm)   Pulse 86   Temp (!) 100.8 F (38.2 C) (Oral)   Resp 17   SpO2 99%  Physical Exam Vitals and nursing note reviewed.  Constitutional:      General: He is not in acute distress.    Appearance: He is well-developed and normal weight. He is not ill-appearing, toxic-appearing or diaphoretic.  HENT:     Head: Normocephalic and atraumatic.     Mouth/Throat:     Mouth: Mucous membranes are moist.     Tonsils: Tonsillar exudate present. 4+ on the right. 4+ on the left.  Eyes:     Conjunctiva/sclera: Conjunctivae normal.  Cardiovascular:     Rate and Rhythm: Normal rate and regular rhythm.     Heart sounds: No murmur heard. Pulmonary:     Effort: Pulmonary effort is normal. No respiratory distress.  Abdominal:     General: There is no distension.     Palpations: Abdomen is soft.  Musculoskeletal:        General: No swelling.     Cervical back: Normal range of motion and neck supple.  Skin:    General: Skin is warm and dry.     Capillary Refill: Capillary refill takes less than 2 seconds.     Coloration: Skin is not pale.  Neurological:     General: No focal deficit present.     Mental Status: He is alert and oriented to person, place, and time.  Psychiatric:        Mood and Affect: Mood normal.        Behavior: Behavior normal.     ED Results / Procedures / Treatments   Labs (all labs ordered are listed, but only abnormal results are displayed) Labs Reviewed  COMPREHENSIVE METABOLIC PANEL - Abnormal; Notable for the following components:      Result Value   Total Protein 9.0 (*)    Alkaline Phosphatase 138 (*)    All other components within normal limits  CBC WITH DIFFERENTIAL/PLATELET - Abnormal; Notable for the following components:   WBC 11.9 (*)    Lymphs Abs 4.2 (*)    All other components within normal limits  URINALYSIS, ROUTINE W REFLEX MICROSCOPIC  - Abnormal; Notable for the following components:   Specific Gravity, Urine 1.044 (*)    Ketones, ur 80 (*)    All other components within normal limits  GROUP A STREP BY PCR  CULTURE, BLOOD (ROUTINE X 2)  CULTURE, BLOOD (ROUTINE X 2)  LACTIC ACID, PLASMA  LACTIC ACID, PLASMA    EKG None  Radiology CT Soft Tissue Neck W Contrast  Result Date: 10/19/2021 CLINICAL DATA:  Epiglottitis or tonsillitis suspected.  Sore throat EXAM: CT NECK WITH CONTRAST TECHNIQUE: Multidetector CT imaging of the neck was performed using the standard protocol following the bolus administration of intravenous contrast. RADIATION DOSE REDUCTION: This exam was performed according to the departmental dose-optimization program which includes automated exposure control, adjustment of the mA and/or kV according to patient size and/or use of iterative reconstruction technique. CONTRAST:  72m OMNIPAQUE IOHEXOL 300 MG/ML  SOLN COMPARISON:  None Available. FINDINGS: Pharynx and larynx: Thickening of adenoid and palatine tonsils. No abscess or retropharyngeal fluid. No laryngeal edema. Salivary glands: No inflammation, mass, or stone. Thyroid: Normal. Lymph nodes: Expected enlargement of bilateral upper jugular chain lymph nodes without cavitation. Vascular: Negative Limited intracranial: Negative Visualized orbits: Negative Mastoids and visualized paranasal sinuses: Clear Skeleton: No acute or aggressive finding. Upper chest: Clear apical lungs IMPRESSION: Tonsillitis and cervical adenitis without abscess. Electronically Signed   By: JJorje GuildM.D.   On: 10/19/2021 09:06    Procedures Procedures    Medications Ordered in ED Medications  lactated ringers bolus 1,000 mL (0 mLs Intravenous Stopped 10/19/21 0929)  dexamethasone (DECADRON) injection 10 mg (10 mg Intravenous Given 10/19/21 0758)  Ampicillin-Sulbactam (UNASYN) 3 g in sodium chloride 0.9 % 100 mL IVPB (0 g Intravenous Stopped 10/19/21 0929)  ketorolac (TORADOL) 15  MG/ML injection 15 mg (15 mg Intravenous Given 10/19/21 0758)  sodium chloride (PF) 0.9 % injection (  Given 10/19/21 0856)  iohexol (OMNIPAQUE) 300 MG/ML solution 75 mL (75 mLs Intravenous Contrast Given 10/19/21 0849)    ED Course/ Medical Decision Making/ A&P                           Medical Decision Making Amount and/or Complexity of Data Reviewed Labs: ordered. Radiology: ordered.  Risk Prescription drug management.   This patient presents to the ED for concern of sore throat, this involves an extensive number of treatment options, and is a complaint that carries with it a high risk of complications and morbidity.  The differential diagnosis includes pharyngitis, tonsillitis, PTA, RPA   Co morbidities that complicate the patient  evaluation  Chronic knee pain, depression   Additional history obtained:  Additional history obtained from patient's mother External records from outside source obtained and reviewed including EMR   Lab Tests:  I Ordered, and personally interpreted labs.  The pertinent results include: Leukocytosis is present.  Ketonuria is present consistent with poor p.o. intake.  Electrolytes are normal.   Imaging Studies ordered:  I ordered imaging studies including CT of neck I independently visualized and interpreted imaging which showed tonsillitis with no drainable abscess I agree with the radiologist interpretation   Cardiac Monitoring: / EKG:  The patient was maintained on a cardiac monitor.  I personally viewed and interpreted the cardiac monitored which showed an underlying rhythm of: Sinus rhythm   Problem List / ED Course / Critical interventions / Medication management  Patient is a healthy 20 year old male presenting for sore throat symptoms for the past 3 days.  On day of onset, he was seen in urgent care and was prescribed penicillin.  He reports continued symptoms despite this.  He has been taking ibuprofen and Tylenol at home but has not  taken any today.  Along with a sore throat, he has developed a muffled voice and difficulty swallowing.  On arrival, patient has a low-grade fever.  He is well-appearing on exam.  He does have 3-4+ bilateral tonsillar swelling with exudates, consistent with tonsillitis.  Tonsils are symmetric on exam.  Patient was given IV antibiotics, IV fluids, Decadron, and Toradol.  He underwent CT imaging which did not show any drainable abscesses.  He did report improved symptoms while in the ED.  He is able to tolerate p.o. intake.  Patient was prescribed clindamycin and prednisone.  He was advised to continue supportive care with Tylenol and ibuprofen as needed.  He has been seen by ENT in the past and he will reach out to them to schedule an appointment for follow-up this week.  He was given contact information for ENT doctor on-call as an alternative.  He was discharged in good condition. I ordered medication including IV fluids for poor p.o. intake; antibiotics and Decadron for tonsillitis; Toradol for analgesia Reevaluation of the patient after these medicines showed that the patient improved I have reviewed the patients home medicines and have made adjustments as needed        Final Clinical Impression(s) / ED Diagnoses Final diagnoses:  Tonsillitis    Rx / DC Orders ED Discharge Orders          Ordered    clindamycin (CLEOCIN) 150 MG capsule  3 times daily        10/19/21 1116    predniSONE (DELTASONE) 10 MG tablet  Daily        10/19/21 1116              Godfrey Pick, MD 10/19/21 1120

## 2021-10-19 NOTE — Discharge Instructions (Addendum)
A prescription for a different antibiotic was sent to your pharmacy.  Take this as prescribed and stop using the penicillin.  Additionally, there is a prescription for continued steroid therapy for the next 4 days, starting tomorrow.  Continue taking ibuprofen and Tylenol as needed for fevers or discomfort.  Below you will find telephone numbers to call to set up ENT follow-up.  If Dr. Pryor Ochoa is unable to see you in office earlier this week, contact Dr. Redmond Baseman as an alternative.  Your symptoms should improve.  Return to the ED if they do not.

## 2021-10-19 NOTE — ED Triage Notes (Signed)
Ambulatory to ED with c/o sore throat. Dx with tonsilitis on Wednesday, started on pcn. Strep negative. Per mother, pcn is not working.

## 2021-10-19 NOTE — ED Notes (Signed)
Patient transported to CT 

## 2021-10-24 LAB — CULTURE, BLOOD (ROUTINE X 2)
Culture: NO GROWTH
Culture: NO GROWTH
Special Requests: ADEQUATE

## 2021-11-29 ENCOUNTER — Ambulatory Visit: Payer: 59 | Admitting: Family Medicine

## 2022-03-13 ENCOUNTER — Ambulatory Visit: Payer: 59

## 2022-04-02 ENCOUNTER — Ambulatory Visit (INDEPENDENT_AMBULATORY_CARE_PROVIDER_SITE_OTHER): Payer: 59

## 2022-04-02 DIAGNOSIS — Z23 Encounter for immunization: Secondary | ICD-10-CM

## 2022-04-03 ENCOUNTER — Ambulatory Visit: Payer: 59

## 2022-06-20 ENCOUNTER — Encounter: Payer: 59 | Admitting: Family

## 2022-07-14 ENCOUNTER — Encounter: Payer: 59 | Admitting: Family Medicine

## 2022-08-01 ENCOUNTER — Telehealth: Payer: Self-pay | Admitting: Family

## 2022-08-01 ENCOUNTER — Encounter: Payer: Self-pay | Admitting: Family

## 2022-08-01 ENCOUNTER — Ambulatory Visit (INDEPENDENT_AMBULATORY_CARE_PROVIDER_SITE_OTHER): Payer: 59 | Admitting: Family

## 2022-08-01 VITALS — BP 122/82 | HR 70 | Temp 97.8°F | Ht 67.5 in | Wt 184.2 lb

## 2022-08-01 DIAGNOSIS — D72829 Elevated white blood cell count, unspecified: Secondary | ICD-10-CM | POA: Diagnosis not present

## 2022-08-01 DIAGNOSIS — Z13220 Encounter for screening for lipoid disorders: Secondary | ICD-10-CM | POA: Diagnosis not present

## 2022-08-01 DIAGNOSIS — J45990 Exercise induced bronchospasm: Secondary | ICD-10-CM

## 2022-08-01 DIAGNOSIS — Z0000 Encounter for general adult medical examination without abnormal findings: Secondary | ICD-10-CM | POA: Diagnosis not present

## 2022-08-01 DIAGNOSIS — R779 Abnormality of plasma protein, unspecified: Secondary | ICD-10-CM | POA: Diagnosis not present

## 2022-08-01 DIAGNOSIS — Z114 Encounter for screening for human immunodeficiency virus [HIV]: Secondary | ICD-10-CM

## 2022-08-01 DIAGNOSIS — Z202 Contact with and (suspected) exposure to infections with a predominantly sexual mode of transmission: Secondary | ICD-10-CM | POA: Insufficient documentation

## 2022-08-01 DIAGNOSIS — J301 Allergic rhinitis due to pollen: Secondary | ICD-10-CM

## 2022-08-01 DIAGNOSIS — Z1159 Encounter for screening for other viral diseases: Secondary | ICD-10-CM | POA: Insufficient documentation

## 2022-08-01 LAB — LIPID PANEL
Cholesterol: 171 mg/dL (ref 0–200)
HDL: 35.7 mg/dL — ABNORMAL LOW (ref >39.00)
LDL Cholesterol: 116 mg/dL — ABNORMAL HIGH (ref 0–99)
NonHDL: 135.64
Total CHOL/HDL Ratio: 5
Triglycerides: 99 mg/dL (ref 0.0–149.0)
VLDL: 19.8 mg/dL (ref 0.0–40.0)

## 2022-08-01 LAB — CBC
HCT: 43.3 % (ref 39.0–52.0)
Hemoglobin: 14.7 g/dL (ref 13.0–17.0)
MCHC: 34 g/dL (ref 30.0–36.0)
MCV: 88.8 fl (ref 78.0–100.0)
Platelets: 286 10*3/uL (ref 150.0–400.0)
RBC: 4.88 Mil/uL (ref 4.22–5.81)
RDW: 12.9 % (ref 11.5–15.5)
WBC: 6 10*3/uL (ref 4.0–10.5)

## 2022-08-01 LAB — BASIC METABOLIC PANEL
BUN: 14 mg/dL (ref 6–23)
CO2: 30 mEq/L (ref 19–32)
Calcium: 9.5 mg/dL (ref 8.4–10.5)
Chloride: 101 mEq/L (ref 96–112)
Creatinine, Ser: 0.99 mg/dL (ref 0.40–1.50)
GFR: 107.72 mL/min (ref >60.00)
Glucose, Bld: 85 mg/dL (ref 70–99)
Potassium: 3.8 mEq/L (ref 3.5–5.1)
Sodium: 138 mEq/L (ref 135–145)

## 2022-08-01 MED ORDER — ALBUTEROL SULFATE HFA 108 (90 BASE) MCG/ACT IN AERS: 2.0000 | INHALATION_SPRAY | Freq: Four times a day (QID) | RESPIRATORY_TRACT | 0 refills | Status: DC | PRN
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
## 2022-08-01 NOTE — Addendum Note (Signed)
Addended by: Alvina Chou on: 08/01/2022 11:04 AM   Modules accepted: Orders

## 2022-08-01 NOTE — Telephone Encounter (Signed)
Patient called in and stated that he was suppose to have a inhaler sent in for her him. He contactef pharmacy and they haven't received anything. Thank you!

## 2022-08-01 NOTE — Patient Instructions (Signed)
  Rhinocort or nasocort for the allergies as well as xyzal recommended.  You could also try flonase with nasal mist   Albuterol to be used 30 minutes prior to exercise to see if this helps    Stop by the lab prior to leaving today. I will notify you of your results once received.   Recommendations on keeping yourself healthy:  - Exercise at least 30-45 minutes a day, 3-4 days a week.  - Eat a low-fat diet with lots of fruits and vegetables, up to 7-9 servings per day.  - Seatbelts can save your life. Wear them always.  - Smoke detectors on every level of your home, check batteries every year.  - Eye Doctor - have an eye exam every 1-2 years  - Safe sex - if you may be exposed to STDs, use a condom.  - Alcohol -  If you drink, do it moderately, less than 2 drinks per day.  - Health Care Power of Attorney. Choose someone to speak for you if you are not able.  - Depression is common in our stressful world.If you're feeling down or losing interest in things you normally enjoy, please come in for a visit.  - Violence - If anyone is threatening or hurting you, please call immediately.  Due to recent changes in healthcare laws, you may see results of your imaging and/or laboratory studies on MyChart before I have had a chance to review them.  I understand that in some cases there may be results that are confusing or concerning to you. Please understand that not all results are received at the same time and often I may need to interpret multiple results in order to provide you with the best plan of care or course of treatment. Therefore, I ask that you please give me 2 business days to thoroughly review all your results before contacting my office for clarification. Should we see a critical lab result, you will be contacted sooner.   I will see you again in one year for your annual comprehensive exam unless otherwise stated and or with acute concerns.  It was a pleasure seeing you today! Please do  not hesitate to reach out with any questions and or concerns.  Regards,   Mort Sawyers

## 2022-08-01 NOTE — Assessment & Plan Note (Signed)
Safe sex d/w pt .  Std panel ordered today. Pending results.   

## 2022-08-01 NOTE — Addendum Note (Signed)
Addended by: Mort Sawyers on: 08/01/2022 08:29 PM   Modules accepted: Orders

## 2022-08-01 NOTE — Assessment & Plan Note (Signed)
Trial albuterol 30 min prior to exercise prn  Spirometry testing ordered

## 2022-08-01 NOTE — Assessment & Plan Note (Addendum)
Stop zyrtec change to xyzal to see if improvement of symptoms Recommend nose spray as well

## 2022-08-01 NOTE — Addendum Note (Signed)
Addended by: Alvina Chou on: 08/01/2022 10:47 AM   Modules accepted: Orders

## 2022-08-02 LAB — HEPATITIS C ANTIBODY: Hepatitis C Ab: NONREACTIVE

## 2022-08-02 LAB — RPR: RPR Ser Ql: NONREACTIVE

## 2022-08-02 LAB — C. TRACHOMATIS/N. GONORRHOEAE RNA
C. trachomatis RNA, TMA: NOT DETECTED
N. gonorrhoeae RNA, TMA: NOT DETECTED

## 2022-08-02 LAB — HIV ANTIBODY (ROUTINE TESTING W REFLEX): HIV 1&2 Ab, 4th Generation: NONREACTIVE

## 2022-09-02 ENCOUNTER — Ambulatory Visit: Payer: 59 | Admitting: Internal Medicine

## 2022-09-02 ENCOUNTER — Encounter: Payer: Self-pay | Admitting: Internal Medicine

## 2022-09-02 VITALS — BP 92/60 | HR 86 | Temp 98.3°F | Ht 67.5 in | Wt 182.0 lb

## 2022-09-02 DIAGNOSIS — J0391 Acute recurrent tonsillitis, unspecified: Secondary | ICD-10-CM | POA: Diagnosis not present

## 2022-09-02 LAB — POCT RAPID STREP A (OFFICE): Rapid Strep A Screen: NEGATIVE

## 2022-09-02 MED ORDER — AMOXICILLIN-POT CLAVULANATE 875-125 MG PO TABS: 1.0000 | ORAL_TABLET | Freq: Two times a day (BID) | ORAL | 0 refills | Status: DC

## 2022-09-02 NOTE — Assessment & Plan Note (Signed)
Had 2 spells last year--this is the first this year Strep test negative--but could be false negative Will broaden coverage though with augmentin 875 bid If worsens, trouble swallowing, etc---to ER

## 2022-09-02 NOTE — Progress Notes (Signed)
Subjective:    Patient ID: Justin Stewart, male    DOB: June 13, 2000, 22 y.o.   MRN: [id number]  HPI Here with mom due to a sore throat  Fever and headache for 2-3 days Mom noticed redness at the beginning Now having sore throat  Feels weak Temp 101+ this morning No cough No rash Has noticed some swollen glands in neck  Tried ibuprofen yesterday--helped him fall aslee  Current Outpatient Medications on File Prior to Visit  Medication Sig Dispense Refill   acetaminophen (TYLENOL) 500 MG tablet Take 1,000 mg by mouth every 4 (four) hours as needed (pain).     albuterol (VENTOLIN HFA) 108 (90 Base) MCG/ACT inhaler Inhale 2 puffs into the lungs every 6 (six) hours as needed for wheezing or shortness of breath. 8 g 0   cetirizine (ZYRTEC) 5 MG tablet Take 10 mg by mouth daily.     cyclobenzaprine (FLEXERIL) 5 MG tablet Take 5 mg by mouth 3 (three) times daily as needed.     ibuprofen (ADVIL) 200 MG tablet Take 800 mg by mouth every 8 (eight) hours as needed (pain).     Multiple Vitamin (MULTIVITAMIN) capsule Take 1 capsule by mouth daily.     No current facility-administered medications on file prior to visit.    Allergies  Allergen Reactions   Flonase [Fluticasone] Other (See Comments)    Nose bleeds    Past Medical History:  Diagnosis Date   Acne     Past Surgical History:  Procedure Laterality Date   MOUTH SURGERY      Family History  Problem Relation Age of Onset   Hypertension Mother    Iron deficiency Mother    Deep vein thrombosis Father    Healthy Sister    Healthy Brother    Diabetes Maternal Grandfather    Hypertension Maternal Grandfather    Stroke Maternal Grandmother 1    Social History   Socioeconomic History   Marital status: Single    Spouse name: Not on file   Number of children: 0   Years of education: Not on file   Highest education level: Not on file  Occupational History   Occupation: unemployed  Tobacco Use   Smoking status: Never    Smokeless tobacco: Never  Vaping Use   Vaping Use: Never used  Substance and Sexual Activity   Alcohol use: Yes    Comment: monthly, 3-4 drinks   Drug use: Never   Sexual activity: Yes    Partners: Female    Birth control/protection: Condom  Other Topics Concern   Not on file  Social History Narrative   Currently in 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    Lives with parents and younger brother - has an older sister in [place]   Enjoys: spend time with friends, play video games, go to the movies   Exercise: nothing currently   Diet: balanced   Sleep: not great      Social Determinants of Health   Financial Resource Strain: Low Risk  (08/09/2018)   Overall Financial Resource Strain (CARDIA)    Difficulty of Paying Living Expenses: Not hard at all  Food Insecurity: Not on file  Transportation Needs: Not on file  Physical Activity: Not on file  Stress: Not on file  Social Connections: Not on file  Intimate Partner Violence: Not on file   Review of Systems No N/V Appetite is off No ill exposures No job or work currently  Objective:   Physical Exam Constitutional:      Appearance: Normal appearance.  HENT:     Head:     Comments: Slight frontal tenderness    Right Ear: Tympanic membrane and ear canal normal.     Left Ear: Tympanic membrane and ear canal normal.     Mouth/Throat:     Comments: 3+ inflamed right tonsil----mildly red on left No abscess Neck:     Comments: Slight tenderness in upper anterior cervical chain--no major enlargement Pulmonary:     Effort: Pulmonary effort is normal.     Breath sounds: Normal breath sounds. No wheezing or rales.  Musculoskeletal:     Cervical back: Neck supple.  Skin:    Findings: No rash.  Neurological:     Mental Status: He is alert.            Assessment & Plan:

## 2022-09-02 NOTE — Addendum Note (Signed)
Addended by: Eual Fines on: 09/02/2022 12:21 PM   Modules accepted: Orders

## 2022-09-03 ENCOUNTER — Ambulatory Visit: Payer: 59 | Admitting: Family

## 2022-09-03 ENCOUNTER — Encounter (HOSPITAL_COMMUNITY): Payer: Self-pay

## 2022-09-03 ENCOUNTER — Other Ambulatory Visit: Payer: Self-pay

## 2022-09-03 ENCOUNTER — Emergency Department (HOSPITAL_COMMUNITY)
Admission: EM | Admit: 2022-09-03 | Discharge: 2022-09-03 | Disposition: A | Discharge status: Home / Self Care | Payer: 59 | Attending: Emergency Medicine | Admitting: Emergency Medicine

## 2022-09-03 DIAGNOSIS — J029 Acute pharyngitis, unspecified: Secondary | ICD-10-CM | POA: Insufficient documentation

## 2022-09-03 DIAGNOSIS — R519 Headache, unspecified: Secondary | ICD-10-CM | POA: Insufficient documentation

## 2022-09-03 DIAGNOSIS — R509 Fever, unspecified: Secondary | ICD-10-CM | POA: Insufficient documentation

## 2022-09-03 DIAGNOSIS — D72829 Elevated white blood cell count, unspecified: Secondary | ICD-10-CM | POA: Diagnosis not present

## 2022-09-03 DIAGNOSIS — R0602 Shortness of breath: Secondary | ICD-10-CM | POA: Insufficient documentation

## 2022-09-03 LAB — BASIC METABOLIC PANEL
Anion gap: 9 (ref 5–15)
BUN: 17 mg/dL (ref 6–20)
CO2: 24 mmol/L (ref 22–32)
Calcium: 8.9 mg/dL (ref 8.9–10.3)
Chloride: 102 mmol/L (ref 98–111)
Creatinine, Ser: 1.05 mg/dL (ref 0.61–1.24)
GFR, Estimated: >60 mL/min (ref >60)
Glucose, Bld: 102 mg/dL — ABNORMAL HIGH (ref 70–99)
Potassium: 3.7 mmol/L (ref 3.5–5.1)
Sodium: 135 mmol/L (ref 135–145)

## 2022-09-03 LAB — CBC WITH DIFFERENTIAL/PLATELET
Abs Immature Granulocytes: 0.08 10*3/uL — ABNORMAL HIGH (ref 0.00–0.07)
Basophils Absolute: 0.1 10*3/uL (ref 0.0–0.1)
Basophils Relative: 0 %
Eosinophils Absolute: 0 10*3/uL (ref 0.0–0.5)
Eosinophils Relative: 0 %
HCT: 44.1 % (ref 39.0–52.0)
Hemoglobin: 15 g/dL (ref 13.0–17.0)
Immature Granulocytes: 1 %
Lymphocytes Relative: 13 %
Lymphs Abs: 1.7 10*3/uL (ref 0.7–4.0)
MCH: 29.6 pg (ref 26.0–34.0)
MCHC: 34 g/dL (ref 30.0–36.0)
MCV: 87 fL (ref 80.0–100.0)
Monocytes Absolute: 1.2 10*3/uL — ABNORMAL HIGH (ref 0.1–1.0)
Monocytes Relative: 9 %
Neutro Abs: 10 10*3/uL — ABNORMAL HIGH (ref 1.7–7.7)
Neutrophils Relative %: 77 %
Platelets: 240 10*3/uL (ref 150–400)
RBC: 5.07 MIL/uL (ref 4.22–5.81)
RDW: 12.8 % (ref 11.5–15.5)
WBC: 12.9 10*3/uL — ABNORMAL HIGH (ref 4.0–10.5)
nRBC: 0 % (ref 0.0–0.2)

## 2022-09-03 MED ORDER — LIDOCAINE VISCOUS HCL 2 % MT SOLN
15.0000 mL | Freq: Once | OROMUCOSAL | Status: AC
  Administered 2022-09-03: 15 mL via OROMUCOSAL
  Filled 2022-09-03: qty 15

## 2022-09-03 MED ORDER — KETOROLAC TROMETHAMINE 15 MG/ML IJ SOLN
15.0000 mg | Freq: Once | INTRAMUSCULAR | Status: AC
  Administered 2022-09-03: 15 mg via INTRAMUSCULAR
  Filled 2022-09-03: qty 1

## 2022-09-03 MED ORDER — NAPROXEN 500 MG PO TABS: 500.0000 mg | ORAL_TABLET | Freq: Two times a day (BID) | ORAL | 0 refills | Status: DC

## 2022-09-03 MED ORDER — DEXAMETHASONE SODIUM PHOSPHATE 10 MG/ML IJ SOLN
10.0000 mg | Freq: Once | INTRAMUSCULAR | Status: AC
  Administered 2022-09-03: 10 mg via INTRAMUSCULAR
  Filled 2022-09-03: qty 1

## 2022-09-03 NOTE — ED Provider Notes (Signed)
[facility] AT [facility] Provider Note   CSN: [id number] Arrival date & time: 09/03/22  1600     History  Chief Complaint  Patient presents with   Sore Throat    Justin Stewart is a 22 y.o. male with no significant past medical history who presents to the ED due to persistent sore throat, chills, and headache for the past few days.  Also endorses a fever Tmax 101.3 F.  Patient seen yesterday where his strep test was negative and patient was prescribed Augmentin.  History of tonsillitis in the past and has been evaluated by ENT.  Denies trismus, changes to phonation, or shortness of breath.  Admits to difficulty swallowing secondary to pain.  No neck stiffness.  Patient notes he presented to the ED due to concerns about progression of the shortness of breath.  He notes in the past he is developed shortness of breath and difficulty swallowing.  Denies any these concerns currently. Father at bedside. Denies penile symptoms. No concern for STIs.   History obtained from patient and past medical records. No interpreter used during encounter.       Home Medications Prior to Admission medications   Medication Sig Start Date End Date Taking? Authorizing Provider  naproxen (NAPROSYN) 500 MG tablet Take 1 tablet (500 mg total) by mouth 2 (two) times daily. 09/03/22  Yes Kennie Karapetian, Merla Riches, PA-C  acetaminophen (TYLENOL) 500 MG tablet Take 1,000 mg by mouth every 4 (four) hours as needed (pain).    [provider]  albuterol (VENTOLIN HFA) 108 (90 Base) MCG/ACT inhaler Inhale 2 puffs into the lungs every 6 (six) hours as needed for wheezing or shortness of breath. 08/01/22   Mort Sawyers, FNP  amoxicillin-clavulanate (AUGMENTIN) 875-125 MG tablet Take 1 tablet by mouth 2 (two) times daily. 09/02/22   Karie Schwalbe, MD  cetirizine (ZYRTEC) 5 MG tablet Take 10 mg by mouth daily.    [provider]  cyclobenzaprine (FLEXERIL) 5 MG tablet Take 5 mg by  mouth 3 (three) times daily as needed. 06/20/22   [provider]  ibuprofen (ADVIL) 200 MG tablet Take 800 mg by mouth every 8 (eight) hours as needed (pain).    [provider]  Multiple Vitamin (MULTIVITAMIN) capsule Take 1 capsule by mouth daily.    [provider]      Allergies    Flonase [fluticasone]    Review of Systems   Review of Systems  Constitutional:  Positive for chills and fever.  HENT:  Positive for sore throat and trouble swallowing. Negative for voice change.   Respiratory:  Negative for shortness of breath.   Neurological:  Positive for headaches.    Physical Exam Updated Vital Signs BP 113/63   Pulse 87   Temp 98.7 F (37.1 C)   Resp 16   Wt 82 kg   SpO2 100%   BMI 27.90 kg/m  Physical Exam Vitals and nursing note reviewed.  Constitutional:      General: He is not in acute distress.    Appearance: He is not ill-appearing.  HENT:     Head: Normocephalic.     Mouth/Throat:     Comments: 2+ tonsillar hypertrophy. Uvula midline. No changes to phonation.  Tolerating oral secretions without difficulty. Eyes:     Pupils: Pupils are equal, round, and reactive to light.  Neck:     Comments: No meningismus. Cardiovascular:     Rate and Rhythm: Normal rate and regular  rhythm.     Pulses: Normal pulses.     Heart sounds: Normal heart sounds. No murmur heard.    No friction rub. No gallop.  Pulmonary:     Effort: Pulmonary effort is normal.     Breath sounds: Normal breath sounds.     Comments: Lungs clear to auscultation bilaterally.  No stridor or wheeze. Abdominal:     General: Abdomen is flat. There is no distension.     Palpations: Abdomen is soft.     Tenderness: There is no abdominal tenderness. There is no guarding or rebound.  Musculoskeletal:        General: Normal range of motion.     Cervical back: Neck supple.  Skin:    General: Skin is warm and dry.  Neurological:     General: No focal deficit present.      Mental Status: He is alert.  Psychiatric:        Mood and Affect: Mood normal.        Behavior: Behavior normal.     ED Results / Procedures / Treatments   Labs (all labs ordered are listed, but only abnormal results are displayed) Labs Reviewed  CBC WITH DIFFERENTIAL/PLATELET - Abnormal; Notable for the following components:      Result Value   WBC 12.9 (*)    Neutro Abs 10.0 (*)    Monocytes Absolute 1.2 (*)    Abs Immature Granulocytes 0.08 (*)    All other components within normal limits  BASIC METABOLIC PANEL - Abnormal; Notable for the following components:   Glucose, Bld 102 (*)    All other components within normal limits    EKG None  Radiology No results found.  Procedures Procedures    Medications Ordered in ED Medications  dexamethasone (DECADRON) injection 10 mg (10 mg Intramuscular Given 09/03/22 1647)  ketorolac (TORADOL) 15 MG/ML injection 15 mg (15 mg Intramuscular Given 09/03/22 1647)  lidocaine (XYLOCAINE) 2 % viscous mouth solution 15 mL (15 mLs Mouth/Throat Given 09/03/22 1646)    ED Course/ Medical Decision Making/ A&P Clinical Course as of 09/03/22 1806  Wed Sep 03, 2022  1800 WBC(!): 12.9 [CA]    Clinical Course User Index [CA] Mannie Stabile, PA-C                             Medical Decision Making Amount and/or Complexity of Data Reviewed Independent Historian: caregiver    Details: Father at bedside provided some history External Data Reviewed: notes.    Details: PCP note yesterday Labs: ordered. Decision-making details documented in ED Course.  Risk Prescription drug management.   This patient presents to the ED for concern of sore throat, this involves an extensive number of treatment options, and is a complaint that carries with it a high risk of complications and morbidity.  The differential diagnosis includes tonsillitis, peritonsillar abscess, strep throat, viral process, meningitis, etc  22 year old male presents to  the ED due to sore throat, fever, and headache for the past few days.  Seen yesterday for same where his strep test was negative.  Patient discharged with Augmentin with no improvement in symptoms.  History of tonsillitis and is followed by ENT.  Denies changes to phonation, trismus, shortness of breath.  Endorses some difficulty swallowing secondary to pain.  Upon arrival patient afebrile, not tachycardic or hypoxic.  Patient in no acute distress.  Physical exam significant for 2+ tonsillar hypertrophy bilaterally.  Uvula midline.  No changes to phonation.  Patient tolerating oral secretions without difficulty.  No signs of respiratory distress.  Low suspicion for peritonsillar abscess.  Suspect tonsillitis.  Will give IM Decadron and Toradol for symptomatic relief.  Viscous lidocaine given.  Patient already taking Augmentin so no need to repeat strep test.  Patient denies concern for STIs.  Low suspicion for gonorrhea.  No meningismus to suggest meningitis.  6:04 PM reassessed patient at bedside.  Patient admits to improvement in symptoms after Toradol and Decadron.  No evidence of abscess on exam.  CBC significant for leukocytosis at 12.9.  Normal hemoglobin.  Reassuring BMP.  Normal renal function.  Advised patient to call his ENT tomorrow to schedule an appointment for further evaluation.  If this continues to happen patient may need a tonsillectomy.  Patient discharged with naproxen as needed for pain.  Advised patient continue taking his Augmentin as previously prescribed.  No signs of airway compromise or respiratory distress. Patient stable for discharge. Strict ED precautions discussed with patient. Patient states understanding and agrees to plan. Patient discharged home in no acute distress and stable vitals  Has PCP        Final Clinical Impression(s) / ED Diagnoses Final diagnoses:  Pharyngitis, unspecified etiology    Rx / DC Orders ED Discharge Orders          Ordered    naproxen  (NAPROSYN) 500 MG tablet  2 times daily        09/03/22 1805              Jesusita Oka 09/03/22 1806    Charlynne Pander, MD 09/03/22 2328

## 2022-09-03 NOTE — ED Triage Notes (Signed)
Pt reports headache, neck pain, fever, and sore throat.  T.max 101.3 at home Strep test negative yesterday and was started on Augmentin with 3 doses w/o relief.  Hx tonsillitis 1 year ago and followed ENT

## 2022-09-03 NOTE — Discharge Instructions (Signed)
It was a pleasure taking care of you today.  As discussed, your labs are reassuring.  You were given steroids that will work over the next few days.  Please call your ear nose and throat doctor tomorrow to schedule an appointment for further evaluation.  I am sending you home with pain medication.  Take as needed for pain.  Return to the ER for any new or worsening symptoms.

## 2022-09-04 ENCOUNTER — Telehealth: Payer: Self-pay

## 2022-09-04 NOTE — Transitions of Care (Post Inpatient/ED Visit) (Signed)
   09/11/2022  Name: Justin Stewart MRN: [id number] DOB: 2000/07/07  Today's TOC FU Call Status: Today's TOC FU Call Status:: Successful TOC FU Call Competed TOC FU Call Complete Date: 09/11/22  Transition Care Management Follow-up Telephone Call Date of Discharge: 09/03/22 Discharge Facility: [facility]) Type of Discharge: Emergency Department Reason for ED Visit: Other: How have you been since you were released from the hospital?: Better Any questions or concerns?: No  Items Reviewed: Did you receive and understand the discharge instructions provided?: Yes Any new allergies since your discharge?: No Dietary orders reviewed?: NA  Medications Reviewed Today: Medications Reviewed Today     Reviewed by Annabell Sabal, CMA (Certified Medical Assistant) on 09/11/22 at 1509  Med List Status: <None>   Medication Order Taking? Sig Documenting Provider Last Dose Status Informant  acetaminophen (TYLENOL) 500 MG tablet [id number] Yes Take 1,000 mg by mouth every 4 (four) hours as needed (pain). [provider] Taking Active Self  albuterol (VENTOLIN HFA) 108 (90 Base) MCG/ACT inhaler [id number] Yes Inhale 2 puffs into the lungs every 6 (six) hours as needed for wheezing or shortness of breath. Mort Sawyers, FNP Taking Active   amoxicillin-clavulanate (AUGMENTIN) 875-125 MG tablet [id number] Yes Take 1 tablet by mouth 2 (two) times daily. Karie Schwalbe, MD Taking Active   cetirizine (ZYRTEC) 5 MG tablet [id number] Yes Take 10 mg by mouth daily. [provider] Taking Active   cyclobenzaprine (FLEXERIL) 5 MG tablet [id number] Yes Take 5 mg by mouth 3 (three) times daily as needed. [provider] Taking Active   ibuprofen (ADVIL) 200 MG tablet [id number] Yes Take 800 mg by mouth every 8 (eight) hours as needed (pain). [provider] Taking Active Self  Multiple Vitamin (MULTIVITAMIN) capsule [id number] Yes Take 1 capsule by mouth daily. [provider] Taking Active   naproxen (NAPROSYN) 500 MG tablet [id number] Yes Take 1 tablet (500 mg total) by mouth 2 (two) times daily. Mannie Stabile, PA-C Taking Active             Home Care and Equipment/Supplies: Were Home Health Services Ordered?: No Any new equipment or medical supplies ordered?: No  Functional Questionnaire: Do you need assistance with bathing/showering or dressing?: No Do you need assistance with meal preparation?: No Do you need assistance with eating?: No Do you have difficulty maintaining continence: No Do you need assistance with getting out of bed/getting out of a chair/moving?: No Do you have difficulty managing or taking your medications?: No  Follow up appointments reviewed: PCP Follow-up appointment confirmed?: No Specialist Hospital Follow-up appointment confirmed?: No Reason Specialist Follow-Up Not Confirmed: Patient has Specialist Provider Number and will Call for Appointment Do you need transportation to your follow-up appointment?: No Do you understand care options if your condition(s) worsen?: Yes-patient verbalized understanding    SIGNATURE Fredirick Maudlin

## 2022-09-15 NOTE — Telephone Encounter (Signed)
Noted  

## 2022-09-25 ENCOUNTER — Ambulatory Visit
Admission: EM | Admit: 2022-09-25 | Discharge: 2022-09-25 | Disposition: A | Discharge status: Home / Self Care | Payer: 59 | Attending: Urgent Care | Admitting: Urgent Care

## 2022-09-25 ENCOUNTER — Other Ambulatory Visit: Payer: Self-pay | Admitting: Internal Medicine

## 2022-09-25 DIAGNOSIS — J029 Acute pharyngitis, unspecified: Secondary | ICD-10-CM | POA: Diagnosis present

## 2022-09-25 DIAGNOSIS — J0391 Acute recurrent tonsillitis, unspecified: Secondary | ICD-10-CM | POA: Insufficient documentation

## 2022-09-25 LAB — POCT RAPID STREP A (OFFICE): Rapid Strep A Screen: NEGATIVE

## 2022-09-25 NOTE — Discharge Instructions (Addendum)
The results of your rapid strep test was negative.  A confirmatory culture will be sent to the lab and results available within 3 days.  If a positive result, you will be contacted by telephone for recommended treatment.  If your symptoms worsen or new symptoms develop including difficulty breathing, difficulty swallowing causing drooling, difficulty opening closing your mouth, hoarseness of voice, go to the ER for immediate evaluation.

## 2022-09-25 NOTE — ED Provider Notes (Addendum)
Justin Stewart    CSN: [id number] Arrival date & time: 09/25/22  1928      History   Chief Complaint No chief complaint on file.   HPI Justin Stewart is a 22 y.o. male.   HPI  Patient is accompanied by his mother who mostly speaks for him during the visit.  Patient presents to urgent care with concern for tonsillitis x 3 days.  His mother endorses history of tonsillitis that sometimes requires emergency treatment.  She is here to avoid ED trip.  Patient was seen at [facility] ED [facility]) on 09/03/2022 for persistent sore throat, chills, headache.  He endorsed fever of 101.3 F.  Patient was seen the day before where he had strep test negative but was prescribed Augmentin.  No trismus, drooling, hoarseness, stridor.  He has difficult swallow secondary to pain.   Past Medical History:  Diagnosis Date   Acne     Patient Active Problem List   Diagnosis Date Noted   Recurrent tonsillitis 09/02/2022   Encounter for assessment of STD exposure 08/01/2022   Leukocytosis 08/01/2022   Elevated blood protein 08/01/2022   Exercise-induced asthma 08/01/2022   Seasonal allergic rhinitis due to pollen 08/01/2022   Dermoid cyst of face 07/11/2021   Chronic foot pain, right 07/11/2021   Other insomnia 08/09/2018   Chronic pain of left knee 08/09/2018   Mild depression 08/09/2018    Past Surgical History:  Procedure Laterality Date   MOUTH SURGERY         Home Medications    Prior to Admission medications   Medication Sig Start Date End Date Taking? Authorizing Provider  acetaminophen (TYLENOL) 500 MG tablet Take 1,000 mg by mouth every 4 (four) hours as needed (pain).    [provider]  albuterol (VENTOLIN HFA) 108 (90 Base) MCG/ACT inhaler Inhale 2 puffs into the lungs every 6 (six) hours as needed for wheezing or shortness of breath. 08/01/22   Mort Sawyers, FNP  amoxicillin-clavulanate (AUGMENTIN) 875-125 MG tablet Take 1 tablet by mouth 2 (two) times  daily. 09/02/22   Karie Schwalbe, MD  cetirizine (ZYRTEC) 5 MG tablet Take 10 mg by mouth daily.    [provider]  cyclobenzaprine (FLEXERIL) 5 MG tablet Take 5 mg by mouth 3 (three) times daily as needed. 06/20/22   [provider]  ibuprofen (ADVIL) 200 MG tablet Take 800 mg by mouth every 8 (eight) hours as needed (pain).    [provider]  Multiple Vitamin (MULTIVITAMIN) capsule Take 1 capsule by mouth daily.    [provider]  naproxen (NAPROSYN) 500 MG tablet Take 1 tablet (500 mg total) by mouth 2 (two) times daily. 09/03/22   Mannie Stabile, PA-C    Family History Family History  Problem Relation Age of Onset   Hypertension Mother    Iron deficiency Mother    Deep vein thrombosis Father    Healthy Sister    Healthy Brother    Diabetes Maternal Grandfather    Hypertension Maternal Grandfather    Stroke Maternal Grandmother 75    Social History Social History   Tobacco Use   Smoking status: Never   Smokeless tobacco: Never  Vaping Use   Vaping Use: Never used  Substance Use Topics   Alcohol use: Yes    Comment: monthly, 3-4 drinks   Drug use: Never     Allergies   Flonase [fluticasone]   Review of Systems Review of Systems   Physical  Exam Triage Vital Signs ED Triage Vitals  Enc Vitals Group     BP      Pulse      Resp      Temp      Temp src      SpO2      Weight      Height      Head Circumference      Peak Flow      Pain Score      Pain Loc      Pain Edu?      Excl. in GC?    No data found.  Updated Vital Signs There were no vitals taken for this visit.  Visual Acuity Right Eye Distance:   Left Eye Distance:   Bilateral Distance:    Right Eye Near:   Left Eye Near:    Bilateral Near:     Physical Exam Vitals reviewed.  Constitutional:      Appearance: Normal appearance.  HENT:     Mouth/Throat:     Pharynx: Posterior oropharyngeal erythema present.     Tonsils: 2+ on the right. 1+  on the left.     Comments: Uvula midline. Cardiovascular:     Rate and Rhythm: Normal rate and regular rhythm.     Pulses: Normal pulses.     Heart sounds: Normal heart sounds.  Pulmonary:     Effort: Pulmonary effort is normal.     Breath sounds: Normal breath sounds.  Skin:    General: Skin is warm and dry.  Neurological:     General: No focal deficit present.     Mental Status: He is alert and oriented to person, place, and time.  Psychiatric:        Mood and Affect: Mood normal.        Behavior: Behavior normal.      UC Treatments / Results  Labs (all labs ordered are listed, but only abnormal results are displayed) Labs Reviewed - No data to display  EKG   Radiology No results found.  Procedures Procedures (including critical care time)  Medications Ordered in UC Medications - No data to display  Initial Impression / Assessment and Plan / UC Course  I have reviewed the triage vital signs and the nursing notes.  Pertinent labs & imaging results that were available during my care of the patient were reviewed by me and considered in my medical decision making (see chart for details).   Justin Stewart is a 22 y.o. male presenting with recurrent tonsillitis. Patient is afebrile without recent antipyretics, satting well on room air. Overall is well appearing, well hydrated, without respiratory distress.  Pharyngeal erythema is present.  Appearance of right sided peritonsillar exudates versus tonsillar stones.  Right side tonsil is 2+.  Uvula midline.  Reviewed relevant chart history.   Recurrent tonsillitis.  Rapid strep result is negative.  Will send for confirmatory culture.  Recommending supportive care including salt water gargling and use of OTC medication for symptom control including NSAID, pain relieving throat spray.  Recommending follow-up with ENT.  Counseled patient on potential for adverse effects with medications prescribed/recommended today, ER and  return-to-clinic precautions discussed, patient verbalized understanding and agreement with care plan.   Final Clinical Impressions(s) / UC Diagnoses   Final diagnoses:  None   Discharge Instructions   None    ED Prescriptions   None    PDMP not reviewed this encounter.   Charma Igo, [place] 09/25/22 2018  Charma Igo, [place] 09/25/22 2018

## 2022-09-25 NOTE — ED Triage Notes (Signed)
Triage completed by provider.

## 2022-09-26 ENCOUNTER — Ambulatory Visit: Payer: 59 | Admitting: Internal Medicine

## 2022-09-26 ENCOUNTER — Encounter: Payer: Self-pay | Admitting: Internal Medicine

## 2022-09-26 ENCOUNTER — Telehealth: Payer: Self-pay

## 2022-09-26 VITALS — BP 92/60 | HR 70 | Temp 97.6°F | Ht 67.5 in | Wt 180.0 lb

## 2022-09-26 DIAGNOSIS — J0391 Acute recurrent tonsillitis, unspecified: Secondary | ICD-10-CM | POA: Diagnosis not present

## 2022-09-26 LAB — CULTURE, GROUP A STREP (THRC)

## 2022-09-26 MED ORDER — PENICILLIN V POTASSIUM 500 MG PO TABS: 500.0000 mg | ORAL_TABLET | Freq: Three times a day (TID) | ORAL | 1 refills | Status: DC

## 2022-09-26 MED ORDER — PREDNISONE 20 MG PO TABS
40.0000 mg | ORAL_TABLET | Freq: Every day | ORAL | 1 refills | Status: DC
Start: 2022-09-26 — End: 2022-11-27

## 2022-09-26 NOTE — Telephone Encounter (Signed)
Can you clarify Rena ? Was this message prior to pt being seen at urgent care or were they seen at urgent care after the visit? Looks like mom is asking for pcn?  If asking for antibiotic it does not seem appropriate and it appears urgent care did not see anything worthy of bacterial infection bc strep was negative at urgent care. Could be viral tonsillitis and has only been 3-4 days so fits the viral profile.   Of course if symptoms persist and or do not improve over the weekend then be seen in office for re-eval

## 2022-09-26 NOTE — Telephone Encounter (Signed)
Pts mom notified as instructed and voiced understanding.,pts mom does not want to wait until next wk because pt has hx of escalation of tonsillitis to the point of throat swelling and pt has difficulty breathing.pts mom does not want to have to go to ED over weekend. Pts mom thinks pt needs abx.to prevent escalation of symptoms of tonsillitis. Pts mom wanted pt to see the doctor pt saw in May. Pts mom scheduled appt with Dr Alphonsus Sias 09/26/22 at 11:45 with UC & ED precautions and pts mom voiced understanding. Pts mom said pt was asleep when she left him this morning but pt had temp 102 on 09/25/22. Pts mom will not be able to come with pt for appt today but wants pt to face time her so she can be involved int the visit. Sending note to Dr Alphonsus Sias and Alphonsus Sias pool.

## 2022-09-26 NOTE — Telephone Encounter (Signed)
Okay--I will check him today Seems reasonable to give PCN even if findings not clear cut Visit appropriate to exclude findings of abscess

## 2022-09-26 NOTE — Assessment & Plan Note (Signed)
Infectious vs non infectious Strep culture pending but rapid negative Awaiting ENT evaluation Will treat with PCN [place] just in case Prednisone 40 x 3, 20 x 3

## 2022-09-26 NOTE — Progress Notes (Signed)
Subjective:    Patient ID: Justin Stewart, male    DOB: 2000-07-23, 22 y.o.   MRN: [id number]  HPI Here due to recurrent tonsillitis  I treated him with augmentin ~ 3 weeks ago Went to ER the next day--got decadron (which helped) Did feel better for a while---then seemed to feel different about 3 days ago Woke with headache 2 days ago Then fever that night (6/12) Up to 102 in past 2 days  Seen in urgent care---rapid strep negative Culture pending  Since yesterday--feels the fever is better Still feels sluggish Throat is worse--now with visible pus and more swollen  Current Outpatient Medications on File Prior to Visit  Medication Sig Dispense Refill   acetaminophen (TYLENOL) 500 MG tablet Take 1,000 mg by mouth every 4 (four) hours as needed (pain).     albuterol (VENTOLIN HFA) 108 (90 Base) MCG/ACT inhaler Inhale 2 puffs into the lungs every 6 (six) hours as needed for wheezing or shortness of breath. 8 g 0   cetirizine (ZYRTEC) 5 MG tablet Take 10 mg by mouth daily.     cyclobenzaprine (FLEXERIL) 5 MG tablet Take 5 mg by mouth 3 (three) times daily as needed.     ibuprofen (ADVIL) 200 MG tablet Take 800 mg by mouth every 8 (eight) hours as needed (pain).     Multiple Vitamin (MULTIVITAMIN) capsule Take 1 capsule by mouth daily.     naproxen (NAPROSYN) 500 MG tablet Take 1 tablet (500 mg total) by mouth 2 (two) times daily. 30 tablet 0   No current facility-administered medications on file prior to visit.    Allergies  Allergen Reactions   Flonase [Fluticasone] Other (See Comments)    Nose bleeds    Past Medical History:  Diagnosis Date   Acne     Past Surgical History:  Procedure Laterality Date   MOUTH SURGERY      Family History  Problem Relation Age of Onset   Hypertension Mother    Iron deficiency Mother    Deep vein thrombosis Father    Healthy Sister    Healthy Brother    Diabetes Maternal Grandfather    Hypertension Maternal Grandfather    Stroke  Maternal Grandmother 58    Social History   Socioeconomic History   Marital status: Single    Spouse name: Not on file   Number of children: 0   Years of education: Not on file   Highest education level: Not on file  Occupational History   Occupation: unemployed  Tobacco Use   Smoking status: Never   Smokeless tobacco: Never  Vaping Use   Vaping Use: Never used  Substance and Sexual Activity   Alcohol use: Yes    Comment: monthly, 3-4 drinks   Drug use: Never   Sexual activity: Yes    Partners: Female    Birth control/protection: Condom  Other Topics Concern   Not on file  Social History Narrative   Currently in [facility] and 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    Lives with parents and younger brother - has an older sister in [place]   Enjoys: spend time with friends, play video games, go to the movies   Exercise: nothing currently   Diet: balanced   Sleep: not great      Social Determinants of Health   Financial Resource Strain: Low Risk  (08/09/2018)   Overall Financial Resource Strain (CARDIA)    Difficulty of Paying Living Expenses: Not hard at  all  Food Insecurity: Not on file  Transportation Needs: Not on file  Physical Activity: Not on file  Stress: Not on file  Social Connections: Not on file  Intimate Partner Violence: Not on file   Review of Systems Feels his lymph nodes in neck Some discomfort with swallowing     Objective:   Physical Exam Constitutional:      Appearance: Normal appearance.  HENT:     Ears:     Comments: Cerumen bilaterally    Mouth/Throat:     Comments: Tonsils 2-3+ with exudate Uvula touching right side No clear abscess Neck:     Comments: Non tender anterior cervical nodes Pulmonary:     Effort: Pulmonary effort is normal.     Breath sounds: Normal breath sounds. No wheezing or rales.  Musculoskeletal:     Cervical back: Neck supple.  Neurological:     Mental Status: He is alert.             Assessment & Plan:

## 2022-09-26 NOTE — Telephone Encounter (Signed)
Per chart review tab pt seen [facility] on 09/25/22. Sending note to Hayden Pedro FNP.

## 2022-09-27 LAB — CULTURE, GROUP A STREP (THRC)

## 2022-09-28 LAB — CULTURE, GROUP A STREP (THRC)

## 2022-10-24 ENCOUNTER — Ambulatory Visit: Payer: 59 | Admitting: Family

## 2022-10-24 ENCOUNTER — Encounter: Payer: Self-pay | Admitting: Family

## 2022-10-24 VITALS — BP 98/70 | HR 68 | Temp 98.1°F | Resp 16 | Ht 67.5 in | Wt 175.8 lb

## 2022-10-24 DIAGNOSIS — J039 Acute tonsillitis, unspecified: Secondary | ICD-10-CM

## 2022-10-24 DIAGNOSIS — B370 Candidal stomatitis: Secondary | ICD-10-CM

## 2022-10-24 DIAGNOSIS — J0391 Acute recurrent tonsillitis, unspecified: Secondary | ICD-10-CM

## 2022-10-24 DIAGNOSIS — J029 Acute pharyngitis, unspecified: Secondary | ICD-10-CM

## 2022-10-24 LAB — POCT RAPID STREP A (OFFICE): Rapid Strep A Screen: POSITIVE — AB

## 2022-10-24 MED ORDER — NYSTATIN 100000 UNIT/ML MT SUSP
5.0000 mL | Freq: Four times a day (QID) | OROMUCOSAL | 0 refills | Status: DC
Start: 2022-10-24 — End: 2022-12-17

## 2022-10-24 MED ORDER — AMOXICILLIN-POT CLAVULANATE 875-125 MG PO TABS
1.0000 | ORAL_TABLET | Freq: Two times a day (BID) | ORAL | 0 refills | Status: DC
Start: 2022-10-24 — End: 2022-11-27

## 2022-10-24 MED ORDER — PREDNISONE 10 MG (21) PO TBPK
ORAL_TABLET | ORAL | 0 refills | Status: DC
Start: 2022-10-24 — End: 2022-11-27

## 2022-10-24 NOTE — Progress Notes (Signed)
Established Patient Office Visit  Subjective:   Patient ID: Justin Stewart, male    DOB: [birth year]  Age: 22 y.o. MRN: [id number]  CC:  Chief Complaint  Patient presents with   Sore Throat    Swollen tonsils since Monday.  Had headache all week, fever Sunday     HPI: Justin Stewart is a 22 y.o. male presenting on 10/24/2022 for Sore Throat (Swollen tonsils since Monday.  Had headache all week, fever Sunday )  Five days ago started with sore throat, headache, and fever.  Still with increased fatigue.  Headache is still ongoing.  He thinks fever broke yesterday.  Still with nasal congestion.  Fever was up to 101.73F  Was taking ibuprofen with some relief of fever.  No ear pain. No cough.   Also seen in Er 5/22, 6/13 (treated with pcn and steroid) for tonsillitis.  Has appt with ENT coming up.       ROS: Negative unless specifically indicated above in HPI.   Relevant past medical history reviewed and updated as indicated.   Allergies and medications reviewed and updated.   Current Outpatient Medications:    albuterol (VENTOLIN HFA) 108 (90 Base) MCG/ACT inhaler, Inhale 2 puffs into the lungs every 6 (six) hours as needed for wheezing or shortness of breath., Disp: 8 g, Rfl: 0   amoxicillin-clavulanate (AUGMENTIN) 875-125 MG tablet, Take 1 tablet by mouth 2 (two) times daily., Disp: 20 tablet, Rfl: 0   ibuprofen (ADVIL) 200 MG tablet, Take 800 mg by mouth every 8 (eight) hours as needed (pain)., Disp: , Rfl:    levocetirizine (XYZAL) 5 MG tablet, Take 5 mg by mouth every evening., Disp: , Rfl:    Multiple Vitamin (MULTIVITAMIN) capsule, Take 1 capsule by mouth daily., Disp: , Rfl:    naproxen (NAPROSYN) 500 MG tablet, Take 1 tablet (500 mg total) by mouth 2 (two) times daily., Disp: 30 tablet, Rfl: 0   nystatin (MYCOSTATIN) 100000 UNIT/ML suspension, Take 5 mLs (500,000 Units total) by mouth 4 (four) times daily., Disp: 60 mL, Rfl: 0   predniSONE (STERAPRED UNI-PAK 21 TAB) 10 MG (21)  TBPK tablet, Take as directed, Disp: 1 each, Rfl: 0   acetaminophen (TYLENOL) 500 MG tablet, Take 1,000 mg by mouth every 4 (four) hours as needed (pain)., Disp: , Rfl:    predniSONE (DELTASONE) 20 MG tablet, Take 2 tablets (40 mg total) by mouth daily. For 3 days, then 1 tab daily for 3 days (Patient not taking: Reported on 10/24/2022), Disp: 9 tablet, Rfl: 1  Allergies  Allergen Reactions   Flonase [Fluticasone] Other (See Comments)    Nose bleeds    Objective:   BP 98/70 (BP Location: Left Arm, Patient Position: Sitting, Cuff Size: Normal)   Pulse 68   Temp 98.1 F (36.7 C) (Oral)   Resp 16   Ht 5' 7.5" (1.715 m)   Wt 175 lb 12.8 oz (79.7 kg)   SpO2 98%   BMI 27.13 kg/m    Physical Exam Vitals reviewed.  Constitutional:      General: He is not in acute distress.    Appearance: Normal appearance. He is obese. He is not ill-appearing, toxic-appearing or diaphoretic.  HENT:     Head: Normocephalic.     Right Ear: Tympanic membrane normal.     Left Ear: Tympanic membrane normal.     Nose: Nose normal.     Mouth/Throat:     Mouth: Mucous membranes are moist.  Tongue: Lesions (white budding) present.     Pharynx: Posterior oropharyngeal erythema present.     Tonsils: No tonsillar abscesses. 3+ on the right. 2+ on the left.  Eyes:     Pupils: Pupils are equal, round, and reactive to light.  Cardiovascular:     Rate and Rhythm: Normal rate and regular rhythm.  Pulmonary:     Effort: Pulmonary effort is normal.     Breath sounds: Normal breath sounds. No wheezing.  Musculoskeletal:        General: Normal range of motion.     Cervical back: Normal range of motion.  Lymphadenopathy:     Cervical: Cervical adenopathy present.     Right cervical: Superficial cervical adenopathy present.  Neurological:     General: No focal deficit present.     Mental Status: He is alert and oriented to person, place, and time. Mental status is at baseline.  Psychiatric:        Mood and  Affect: Mood normal.        Behavior: Behavior normal.        Thought Content: Thought content normal.        Judgment: Judgment normal.     Assessment & Plan:  Sore throat -     POCT rapid strep A  Oral thrush -     Nystatin; Take 5 mLs (500,000 Units total) by mouth 4 (four) times daily.  Dispense: 60 mL; Refill: 0  Tonsillitis -     Amoxicillin-Pot Clavulanate; Take 1 tablet by mouth 2 (two) times daily.  Dispense: 20 tablet; Refill: 0 -     predniSONE; Take as directed  Dispense: 1 each; Refill: 0  Recurrent tonsillitis Assessment & Plan: Strep negative however worry for peritonsillar abscess and centour criteria x 4  Choosing to treat with rx augmentin 875/125 mg po bid x 10 days Rx prednisone pack for swelling  Nystatin for suspected oral thrush       Follow up plan: Return if symptoms worsen or fail to improve.  Mort Sawyers, FNP

## 2022-10-24 NOTE — Assessment & Plan Note (Signed)
Strep negative however worry for peritonsillar abscess and centour criteria x 4  Choosing to treat with rx augmentin 875/125 mg po bid x 10 days Rx prednisone pack for swelling  Nystatin for suspected oral thrush

## 2022-10-24 NOTE — Patient Instructions (Signed)
 ------------------------------------    Take antibiotics that have been sent to the pharmacy.  Change your toothbrush after 24 hours on the antibiotics.  Gargle with warm salt water as needed for sore throat.   ------------------------------------  

## 2022-11-27 ENCOUNTER — Telehealth: Payer: Self-pay | Admitting: Family

## 2022-11-27 ENCOUNTER — Ambulatory Visit: Payer: 59 | Admitting: Internal Medicine

## 2022-11-27 ENCOUNTER — Encounter: Payer: Self-pay | Admitting: Family

## 2022-11-27 ENCOUNTER — Encounter: Payer: Self-pay | Admitting: Internal Medicine

## 2022-11-27 VITALS — BP 122/68 | HR 62 | Temp 97.9°F | Ht 67.5 in | Wt 179.0 lb

## 2022-11-27 DIAGNOSIS — Q181 Preauricular sinus and cyst: Secondary | ICD-10-CM

## 2022-11-27 DIAGNOSIS — H6120 Impacted cerumen, unspecified ear: Secondary | ICD-10-CM | POA: Insufficient documentation

## 2022-11-27 DIAGNOSIS — H6123 Impacted cerumen, bilateral: Secondary | ICD-10-CM

## 2022-11-27 NOTE — Telephone Encounter (Signed)
Spoke to pt

## 2022-11-27 NOTE — Progress Notes (Signed)
Subjective:    Patient ID: Justin Stewart, male    DOB: 2000/10/25, 22 y.o.   MRN: [id number]  HPI Here due to a bump on his ear  Lump on back of left ear Goes back around 3 weeks No trauma Does wear earring sometimes--but never to bed  ?slight discharge a few days ago--after trying hot compress No pain  Current Outpatient Medications on File Prior to Visit  Medication Sig Dispense Refill   acetaminophen (TYLENOL) 500 MG tablet Take 1,000 mg by mouth every 4 (four) hours as needed (pain).     albuterol (VENTOLIN HFA) 108 (90 Base) MCG/ACT inhaler Inhale 2 puffs into the lungs every 6 (six) hours as needed for wheezing or shortness of breath. 8 g 0   ibuprofen (ADVIL) 200 MG tablet Take 800 mg by mouth every 8 (eight) hours as needed (pain).     levocetirizine (XYZAL) 5 MG tablet Take 5 mg by mouth every evening.     Multiple Vitamin (MULTIVITAMIN) capsule Take 1 capsule by mouth daily.     naproxen (NAPROSYN) 500 MG tablet Take 1 tablet (500 mg total) by mouth 2 (two) times daily. 30 tablet 0   nystatin (MYCOSTATIN) 100000 UNIT/ML suspension Take 5 mLs (500,000 Units total) by mouth 4 (four) times daily. 60 mL 0   No current facility-administered medications on file prior to visit.    Allergies  Allergen Reactions   Flonase [Fluticasone] Other (See Comments)    Nose bleeds    Past Medical History:  Diagnosis Date   Acne     Past Surgical History:  Procedure Laterality Date   MOUTH SURGERY      Family History  Problem Relation Age of Onset   Hypertension Mother    Iron deficiency Mother    Deep vein thrombosis Father    Healthy Sister    Healthy Brother    Diabetes Maternal Grandfather    Hypertension Maternal Grandfather    Stroke Maternal Grandmother 41    Social History   Socioeconomic History   Marital status: Single    Spouse name: Not on file   Number of children: 0   Years of education: Not on file   Highest education level: Not on file  Occupational  History   Occupation: unemployed  Tobacco Use   Smoking status: Never   Smokeless tobacco: Never  Vaping Use   Vaping status: Never Used  Substance and Sexual Activity   Alcohol use: Yes    Comment: monthly, 3-4 drinks   Drug use: Never   Sexual activity: Yes    Partners: Female    Birth control/protection: Condom  Other Topics Concern   Not on file  Social History Narrative   Currently in [employer] and 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    Lives with parents and younger brother - has an older sister in [place]   Enjoys: spend time with friends, play video games, go to the movies   Exercise: nothing currently   Diet: balanced   Sleep: not great      Social Determinants of Health   Financial Resource Strain: Low Risk  (08/09/2018)   Overall Financial Resource Strain (CARDIA)    Difficulty of Paying Living Expenses: Not hard at all  Food Insecurity: Not on file  Transportation Needs: Not on file  Physical Activity: Not on file  Stress: Not on file  Social Connections: Not on file  Intimate Partner Violence: Not on file   Review  of Systems Not sick No fever    Objective:   Physical Exam Constitutional:      Appearance: Normal appearance.  HENT:     Ears:     Comments: Cysts in back of both ears---where the earring hole would go out Much larger on left No inflammation--freely moveable Neurological:     Mental Status: He is alert.            Assessment & Plan:

## 2022-11-27 NOTE — Assessment & Plan Note (Signed)
Probably due to stimulation from earrings Not infected Discussed referral to plastic surgeon for removal--he will hold off for now  If gets red/inflamed---can hot pack and let me know (and I would send cephalexin) Advised to hold off on any earrings for now

## 2022-11-27 NOTE — Assessment & Plan Note (Signed)
Asks for ears to be cleaned also left has distal cerumen--cleaned easily with currette Right has distal cerumen--cleaned but still deep cerumen----will flush

## 2022-11-27 NOTE — Telephone Encounter (Signed)
Patient would like any advice that he could do for the the bump on his ear,until he gets an appointment with the plastic surgeon.

## 2022-12-08 ENCOUNTER — Encounter: Payer: Self-pay | Admitting: Otolaryngology

## 2022-12-12 NOTE — Discharge Instructions (Signed)
T & A INSTRUCTION SHEET - [facility]  CREIGHTON VAUGHT, MD   INFORMATION SHEET FOR A TONSILLECTOMY AND ADENDOIDECTOMY  About Your Tonsils and Adenoids  The tonsils and adenoids are normal body tissues that are part of our immune system.  They normally help to protect us against diseases that may enter our mouth and nose. However, sometimes the tonsils and/or adenoids become too large and obstruct our breathing, especially at night.    If either of these things happen it helps to remove the tonsils and adenoids in order to become healthier. The operation to remove the tonsils and adenoids is called a tonsillectomy and adenoidectomy.  The Location of Your Tonsils and Adenoids  The tonsils are located in the back of the throat on both side and sit in a cradle of muscles. The adenoids are located in the roof of the mouth, behind the nose, and closely associated with the opening of the Eustachian tube to the ear.  Surgery on Tonsils and Adenoids  A tonsillectomy and adenoidectomy is a short operation which takes about thirty minutes.  This includes being put to sleep and being awakened. Tonsillectomies and adenoidectomies are performed at [facility] and may require observation period in the recovery room prior to going home. Children are required to remain in recovery for at least 45 minutes.   Following the Operation for a Tonsillectomy  A cautery machine is used to control bleeding. Bleeding from a tonsillectomy and adenoidectomy is minimal and postoperatively the risk of bleeding is approximately four percent, although this rarely life threatening.  After your tonsillectomy and adenoidectomy post-op care at home: 1. Our patients are able to go home the same day. You may be given prescriptions for pain medications, if indicated. 2. It is extremely important to remember that fluid intake is of utmost importance after a tonsillectomy. The  amount that you drink must be maintained in the postoperative period. A good indication of whether a child is getting enough fluid is whether his/her urine output is constant. As long as children are urinating or wetting their diaper every 6 - 8 hours this is usually enough fluid intake.   3. Although rare, this is a risk of some bleeding in the first ten days after surgery. This usually occurs between day five and nine postoperatively. This risk of bleeding is approximately four percent. If you or your child should have any bleeding you should remain calm and notify our office or go directly to the emergency room at [facility] where they will contact us. Our doctors are available seven days a week for notification. We recommend sitting up quietly in a chair, place an ice pack on the front of the neck and spitting out the blood gently until we are able to contact you. Adults should gargle gently with ice water and this may help stop the bleeding. If the bleeding does not stop after a short time, i.e. 10 to 15 minutes, or seems to be increasing again, please contact us or go to the hospital.   4. It is common for the pain to be worse at 5 - 7 days postoperatively. This occurs because the "scab" is peeling off and the mucous membrane (skin of the throat) is growing back where the tonsils were.   5. It is common for a low-grade fever, less than 102, during the first week after a tonsillectomy and adenoidectomy. It is usually due to not   drinking enough liquids, and we suggest your use liquid Tylenol (acetaminophen) or the pain medicine with Tylenol (acetaminophen) prescribed in order to keep your temperature below 102. Please follow the directions on the back of the bottle. 6. Recommendations for post-operative pain in children and adults: a) For Children 12 and younger: Recommendations are for oral Tylenol (acetaminophen) and oral Motrin (Ibuprofen) along with a prescription dose of  Prednisolone which is a steroid to help with pain and swelling. Administer the Tylenol (acetaminophen) and Motrin as stated on bottle for patient's age/weight. Sometimes it may be necessary to alternate the Tylenol (acetaminophen) and Motrin for improved pain control. Motrin does last slightly longer so many patients benefit from being given this prior to bedtime. All children should avoid Aspirin products for 2 weeks following surgery. b) For children over the age of 12: Tylenol (acetaminophen) is the preferred first choice for pain control. Depending on your child's size, sometimes they will be given a combination of Tylenol (acetaminophen) and hydrocodone medication or sometimes it will be recommended they take Motrin (ibuprofen) in addition to the Tylenol (acetaminophen). Narcotics should always be used with caution in children following surgery as they can suppress their breathing and switching to over the counter Tylenol (acetaminophen) and Motrin (ibuprofen) as soon as possible is recommended. All patients should avoid Aspirin products for 2 weeks following surgery. c) Adults: Usually adults will require a narcotic pain medication following a tonsillectomy. This usually has either hydrocodone or oxycodone in it and can usually be taken every 4 to 6 hours as needed for moderate pain. If the medication does not have Tylenol (acetaminophen) in it, you may also supplement Tylenol (acetaminophen) as needed every 4 to 6 hours for breakthrough or mild pain. Adults are also given Viscous Lidocaine to swish and spit every 6 hours to help with topical pain. Adults should avoid Aspirin, Aleve, Motrin, and Ibuprofen products for 2 weeks following surgery as they can increase your risk of bleeding. 7. If you happen to look in the mirror or into your child's mouth you will see white/gray patches on the back of the throat. This is what a scab looks like in the mouth and is normal after having a tonsillectomy and  adenoidectomy. They will disappear once the tonsil areas heal completely. However, it may cause a noticeable odor, and this too will disappear with time.     8. You or your child may experience ear pain after having a tonsillectomy and adenoidectomy.  This is called referred pain and comes from the throat, but it is felt in the ears.  Ear pain is quite common and expected. It will usually go away after ten days. There is usually nothing wrong with the ears, and it is primarily due to the healing area stimulating the nerve to the ear that runs along the side of the throat. Use either the prescribed pain medicine or Tylenol (acetaminophen) as needed.  9. The throat tissues after a tonsillectomy are obviously sensitive. Smoking around children who have had a tonsillectomy significantly increases the risk of bleeding. DO NOT SMOKE!  What to Expect Each Day  First Day at Home 1. Patients will be discharged home the same day.  2. Drink at least four glasses of liquid a day. Clear, cool liquids are recommended. Fruit juices containing citric acid are not recommended because they tend to cause pain. Carbonated beverages are allowed if you pour them from glass to glass to remove the bubbles as these tend to cause   discomfort. Avoid alcoholic beverages.  3. Eat very soft foods such as soups, broth, jello, custard, pudding, ice cream, popsicles, applesauce, mashed potatoes, and in general anything that you can crush between your tongue and the roof of your mouth. Try adding Carnation Instant Breakfast Mix into your food for extra calories. It is not uncommon to lose 5 to 10 pounds of fluid weight. The weight will be gained back quickly once you're feeling better and drinking more.  4. Sleep with your head elevated on two pillows for about three days to help decrease the swelling.  5. DO NOT SMOKE!  Day Two  1. Rest as much as possible. Use common sense in your activities.  2. Continue drinking at least four glasses  of liquid per day.  3. Follow the soft diet.  4. Use your pain medication as needed.  Day Three  1. Advance your activity as you are able and continue to follow the previous day's suggestions.  Days Four Through Six  1. Advance your diet and begin to eat more solid foods such as chopped hamburger. 2. Advance your activities slowly. Children should be kept mostly around the house.  3. Not uncommonly, there will be more pain at this time. It is temporary, usually lasting a day or two.  Day Seven Through Ten  1. Most individuals by this time are able to return to work or school unless otherwise instructed. Consider sending children back to school for a half day on the first day back. 

## 2022-12-17 ENCOUNTER — Ambulatory Visit: Payer: 59 | Admitting: Anesthesiology

## 2022-12-17 ENCOUNTER — Other Ambulatory Visit: Payer: Self-pay

## 2022-12-17 ENCOUNTER — Ambulatory Visit
Admission: RE | Admit: 2022-12-17 | Discharge: 2022-12-17 | Disposition: A | Discharge status: Home / Self Care | Payer: 59 | Attending: Otolaryngology | Admitting: Otolaryngology

## 2022-12-17 ENCOUNTER — Encounter: Payer: Self-pay | Admitting: Otolaryngology

## 2022-12-17 ENCOUNTER — Encounter
Admission: RE | Disposition: A | Discharge status: Home / Self Care | Payer: Self-pay | Source: Home / Self Care | Attending: Otolaryngology

## 2022-12-17 DIAGNOSIS — J039 Acute tonsillitis, unspecified: Secondary | ICD-10-CM | POA: Diagnosis present

## 2022-12-17 DIAGNOSIS — J45990 Exercise induced bronchospasm: Secondary | ICD-10-CM | POA: Diagnosis not present

## 2022-12-17 DIAGNOSIS — J45909 Unspecified asthma, uncomplicated: Secondary | ICD-10-CM | POA: Insufficient documentation

## 2022-12-17 HISTORY — PX: TONSILLECTOMY: SHX5217

## 2022-12-17 HISTORY — DX: Exercise induced bronchospasm: J45.990

## 2022-12-17 SURGERY — TONSILLECTOMY
Anesthesia: General | Laterality: Bilateral

## 2022-12-17 MED ORDER — FENTANYL CITRATE (PF) 100 MCG/2ML IJ SOLN
INTRAMUSCULAR | Status: DC | PRN
  Administered 2022-12-17: 100 ug via INTRAVENOUS

## 2022-12-17 MED ORDER — OXYCODONE HCL 5 MG/5ML PO SOLN: 5.0000 mg | ORAL | 0 refills | Status: DC | PRN

## 2022-12-17 MED ORDER — ACETAMINOPHEN 10 MG/ML IV SOLN
1000.0000 mg | Freq: Once | INTRAVENOUS | Status: AC
  Administered 2022-12-17: 1000 mg via INTRAVENOUS

## 2022-12-17 MED ORDER — SUCCINYLCHOLINE CHLORIDE 200 MG/10ML IV SOSY
PREFILLED_SYRINGE | INTRAVENOUS | Status: DC | PRN
  Administered 2022-12-17: 100 mg via INTRAVENOUS

## 2022-12-17 MED ORDER — PREDNISONE 10 MG (21) PO TBPK: ORAL_TABLET | ORAL | 0 refills | Status: DC

## 2022-12-17 MED ORDER — DEXAMETHASONE SODIUM PHOSPHATE 4 MG/ML IJ SOLN
INTRAMUSCULAR | Status: DC | PRN
  Administered 2022-12-17: 8 mg via INTRAVENOUS

## 2022-12-17 MED ORDER — ONDANSETRON HCL 4 MG/2ML IJ SOLN
INTRAMUSCULAR | Status: DC | PRN
  Administered 2022-12-17: 4 mg via INTRAVENOUS

## 2022-12-17 MED ORDER — OXYCODONE HCL 5 MG/5ML PO SOLN
5.0000 mg | ORAL | 0 refills | Status: DC | PRN
Start: 2022-12-17 — End: 2022-12-17

## 2022-12-17 MED ORDER — LIDOCAINE HCL (CARDIAC) PF 100 MG/5ML IV SOSY
PREFILLED_SYRINGE | INTRAVENOUS | Status: DC | PRN
  Administered 2022-12-17: 30 mg via INTRAVENOUS

## 2022-12-17 MED ORDER — ACETAMINOPHEN 10 MG/ML IV SOLN
INTRAVENOUS | Status: DC | PRN
  Administered 2022-12-17: 1000 mg via INTRAVENOUS

## 2022-12-17 MED ORDER — BUPIVACAINE HCL (PF) 0.25 % IJ SOLN
INTRAMUSCULAR | Status: DC | PRN
  Administered 2022-12-17: 2 mL

## 2022-12-17 MED ORDER — OXYMETAZOLINE HCL 0.05 % NA SOLN
NASAL | Status: DC | PRN
  Administered 2022-12-17: 1 via TOPICAL

## 2022-12-17 MED ORDER — LACTATED RINGERS IV SOLN: INTRAVENOUS | Status: DC

## 2022-12-17 MED ORDER — ONDANSETRON HCL 4 MG PO TABS: 4.0000 mg | ORAL_TABLET | Freq: Three times a day (TID) | ORAL | 0 refills | Status: DC | PRN

## 2022-12-17 MED ORDER — PROPOFOL 10 MG/ML IV BOLUS
INTRAVENOUS | Status: DC | PRN
  Administered 2022-12-17: 50 mg via INTRAVENOUS
  Administered 2022-12-17: 200 mg via INTRAVENOUS

## 2022-12-17 MED ORDER — LIDOCAINE VISCOUS HCL 2 % MT SOLN: 10.0000 mL | Freq: Four times a day (QID) | OROMUCOSAL | 0 refills | Status: DC | PRN

## 2022-12-17 MED ORDER — MIDAZOLAM HCL 5 MG/5ML IJ SOLN
INTRAMUSCULAR | Status: DC | PRN
  Administered 2022-12-17: 2 mg via INTRAVENOUS

## 2022-12-17 MED ORDER — GLYCOPYRROLATE 0.2 MG/ML IJ SOLN
INTRAMUSCULAR | Status: DC | PRN
  Administered 2022-12-17: .2 mg via INTRAVENOUS

## 2022-12-17 MED ORDER — OXYCODONE HCL 5 MG/5ML PO SOLN
10.0000 mg | Freq: Once | ORAL | Status: AC
  Administered 2022-12-17: 10 mg via ORAL

## 2022-12-17 SURGICAL SUPPLY — 15 items
ANTIFOG SOL W/FOAM PAD STRL (MISCELLANEOUS) ×1
BLADE ELECT COATED/INSUL 125 (ELECTRODE) ×1 IMPLANT
CANISTER SUCT 1200ML W/VALVE (MISCELLANEOUS) ×1 IMPLANT
CATH ROBINSON RED A/P 10FR (CATHETERS) ×1 IMPLANT
ELECT REM PT RETURN 9FT ADLT (ELECTROSURGICAL) ×1
ELECTRODE REM PT RTRN 9FT ADLT (ELECTROSURGICAL) ×1 IMPLANT
GLOVE SURG GAMMEX PI TX LF 7.5 (GLOVE) ×1 IMPLANT
KIT TURNOVER KIT A (KITS) ×1 IMPLANT
PACK TONSIL AND ADENOID CUSTOM (PACKS) ×1 IMPLANT
PENCIL SMOKE EVACUATOR (MISCELLANEOUS) ×1 IMPLANT
SLEEVE SUCTION 125 (MISCELLANEOUS) ×1 IMPLANT
SOLUTION ANTFG W/FOAM PAD STRL (MISCELLANEOUS) ×1 IMPLANT
SPONGE TONSIL 1 RF SGL (DISPOSABLE) IMPLANT
STRAP BODY AND KNEE 60X3 (MISCELLANEOUS) ×1 IMPLANT
SYR 5ML LL (SYRINGE) ×1 IMPLANT

## 2022-12-17 NOTE — Anesthesia Preprocedure Evaluation (Addendum)
Anesthesia Evaluation  Patient identified by MRN, date of birth, ID band Patient awake    Reviewed: Allergy & Precautions, H&P , NPO status , Patient's Chart, lab work & pertinent test results  Airway Mallampati: I  TM Distance: >3 FB Neck ROM: Full    Dental no notable dental hx. (+) Caps   Pulmonary asthma    Pulmonary exam normal breath sounds clear to auscultation       Cardiovascular negative cardio ROS Normal cardiovascular exam Rhythm:Regular Rate:Normal     Neuro/Psych  PSYCHIATRIC DISORDERS  Depression    negative neurological ROS  negative psych ROS   GI/Hepatic negative GI ROS, Neg liver ROS,,,  Endo/Other  negative endocrine ROS    Renal/GU negative Renal ROS  negative genitourinary   Musculoskeletal negative musculoskeletal ROS (+)    Abdominal   Peds negative pediatric ROS (+)  Hematology negative hematology ROS (+)   Anesthesia Other Findings Exercise-induced asthma, clear today  Reproductive/Obstetrics negative OB ROS                             Anesthesia Physical Anesthesia Plan  ASA: 2  Anesthesia Plan: General ETT   Post-op Pain Management:    Induction: Intravenous  PONV Risk Score and Plan:   Airway Management Planned: Oral ETT  Additional Equipment:   Intra-op Plan:   Post-operative Plan: Extubation in OR  Informed Consent: I have reviewed the patients History and Physical, chart, labs and discussed the procedure including the risks, benefits and alternatives for the proposed anesthesia with the patient or authorized representative who has indicated his/her understanding and acceptance.     Dental Advisory Given  Plan Discussed with: Anesthesiologist, CRNA and Surgeon  Anesthesia Plan Comments: (Patient consented for risks of anesthesia including but not limited to:  - adverse reactions to medications - damage to eyes, teeth, lips or other oral  mucosa - nerve damage due to positioning  - sore throat or hoarseness - Damage to heart, brain, nerves, lungs, other parts of body or loss of life  Patient voiced understanding.)       Anesthesia Quick Evaluation

## 2022-12-17 NOTE — Anesthesia Procedure Notes (Signed)
Procedure Name: Intubation Date/Time: 12/17/2022 7:38 AM  Performed by: Andee Poles, CRNAPre-anesthesia Checklist: Patient identified, Emergency Drugs available, Suction available, Patient being monitored and Timeout performed Patient Re-evaluated:Patient Re-evaluated prior to induction Oxygen Delivery Method: Circle system utilized Preoxygenation: Pre-oxygenation with 100% oxygen Induction Type: IV induction Ventilation: Mask ventilation without difficulty Laryngoscope Size: Mac and 4 Grade View: Grade I Tube type: Oral Rae Tube size: 7.5 mm Number of attempts: 1 Placement Confirmation: ETT inserted through vocal cords under direct vision, positive ETCO2 and breath sounds checked- equal and bilateral Tube secured with: Tape Dental Injury: Teeth and Oropharynx as per pre-operative assessment

## 2022-12-17 NOTE — Anesthesia Postprocedure Evaluation (Signed)
Anesthesia Post Note  Patient: Justin Stewart  Procedure(s) Performed: TONSILLECTOMY (Bilateral)  Patient location during evaluation: PACU Anesthesia Type: General Level of consciousness: awake and alert Pain management: pain level controlled Vital Signs Assessment: post-procedure vital signs reviewed and stable Respiratory status: spontaneous breathing, nonlabored ventilation, respiratory function stable and patient connected to nasal cannula oxygen Cardiovascular status: blood pressure returned to baseline and stable Postop Assessment: no apparent nausea or vomiting Anesthetic complications: no   No notable events documented.   Last Vitals:  Vitals:   12/17/22 0845 12/17/22 0849  BP: 131/87   Pulse: 60   Resp: 18   Temp:  (!) 36.4 C  SpO2: 99%     Last Pain:  Vitals:   12/17/22 0849  TempSrc:   PainSc: 0-No pain                 Malaya Cagley C Verdella Laidlaw

## 2022-12-17 NOTE — Op Note (Signed)
..  12/17/2022  8:03 AM    Justin Stewart  [id number]   Pre-Op Dx:  Tonsillitis, chronic  Post-op Dx: Tonsillitis, chronic  Proc:Tonsillectomy > age 22  Surg: Vash Quezada  Anes:  General Endotracheal  EBL:  10ml  Comp:  None  Findings:  3+ cryptic tonsils with tonsillolithiasis, moderate fibrotic scar to underlying musculature.  Area of purulence within tonsil on patient's left side.  Procedure: After the patient was identified in holding and the history and physical and consent was reviewed, the patient was taken to the operating room and placed in a supine position.  General endotracheal anesthesia was induced in the normal fashion.  At this time, the patient was rotated 45 degrees and a shoulder roll was placed.  At this time, a McIvor mouthgag was inserted into the patient's oral cavity and suspended from the Mayo stand without injury to teeth, lips, or gums.  Next a red rubber catheter was inserted into the patient left nostril for retraction of the uvula and soft palate superiorly.  Next a curved Alice clamp was attached to the patient's right superior tonsillar pole and retracted medially and inferiorly.  A Bovie electrocautery was used to dissect the patient's right tonsil in a subcapsular plane.  Meticulous hemostasis was achieved with Bovie suction cautery.  At this time, the mouth gag was released from suspension for 1 minute.  Attention now was directed to the patient's left side.  In a similar fashion the curved Alice clamp was attached to the superior pole and this was retracted medially and inferiorly and the tonsil was excised in a subcapsular plane with Bovie electrocautery.  After completion of the second tonsil, meticulous hemostasis was continued.  At this time, attention was directed to the patient's adenoids.  Under indirect visualization using an operating mirror, the adenoid tissue was visualized and noted to be absent in nature so no adenoidectomy was done..   Meticulous hemostasis was continued.  At this time, the patient's nasal cavity and oral cavity was irrigated with sterile saline.  2 ml of 0.25% Marcaine was injected into the anterior and posterior tonsillar fossa bilaterally.  Following this, the care of patient was returned to anesthesia, awakened, and transferred to recovery in stable condition.  Dispo:  PACU to home  Plan: Soft diet.  Limit exercise and strenuous activity for 2 weeks.  Fluid hydration  Recheck my office three weeks.   Roney Mans Justin Stewart 8:03 AM 12/17/2022

## 2022-12-17 NOTE — H&P (Signed)
..  History and Physical paper copy reviewed and updated date of procedure and will be scanned into system.  Patient seen and examined.  

## 2022-12-17 NOTE — Transfer of Care (Signed)
Immediate Anesthesia Transfer of Care Note  Patient: Justin Stewart  Procedure(s) Performed: TONSILLECTOMY (Bilateral)  Patient Location: PACU  Anesthesia Type: General ETT  Level of Consciousness: awake, alert  and patient cooperative  Airway and Oxygen Therapy: Patient Spontanous Breathing and Patient connected to supplemental oxygen  Post-op Assessment: Post-op Vital signs reviewed, Patient's Cardiovascular Status Stable, Respiratory Function Stable, Patent Airway and No signs of Nausea or vomiting  Post-op Vital Signs: Reviewed and stable  Complications: No notable events documented.

## 2022-12-20 ENCOUNTER — Encounter: Payer: Self-pay | Admitting: Otolaryngology

## 2022-12-22 NOTE — Progress Notes (Signed)
noted 

## 2023-04-15 NOTE — Progress Notes (Signed)
 Justin Stewart T. Justin Christley, MD, CAQ [facility] at [facility] [address] [place], [address]  Phone: [phone]  FAX: [phone]  Justin Stewart - 23 y.o. male  MRN [id number]  Date of Birth: [birth year]  Date: 04/16/2023  PCP: Corwin Antu, FNP  Referral: Corwin Antu, FNP  Chief Complaint  Patient presents with   Nasal Congestion    Symptoms started off and on for 2 weeks   Headache   Cough   Subjective:   Justin Stewart is a 23 y.o. very pleasant male patient with Body mass index is 28.88 kg/m. who presents with the following:  The patient presents with some ongoing acute cough and respiratory symptoms.  He has been sick for about 2 weeks.  He did not take a home COVID test.  Congestion and in his head Coughing spurts Feeling bad for a couple of weeks  Minimal sore throat No ear pain No headache Threw up on Sunday No diarrhea No fever - not sure, did feel hot  No smoking    Review of Systems is noted in the HPI, as appropriate  Objective:   BP 100/70 (BP Location: Left Arm, Patient Position: Sitting, Cuff Size: Large)   Pulse 73   Temp 98 F (36.7 C) (Temporal)   Ht 5' 7.5 (1.715 m)   Wt 187 lb 2 oz (84.9 kg)   SpO2 98%   BMI 28.88 kg/m    Gen: WDWN, NAD. Globally Non-toxic HEENT: Throat clear, w/o exudate, R TM clear, L TM - good landmarks, No fluid present. rhinnorhea.  MMM Frontal sinuses: NT Max sinuses: NT NECK: Anterior cervical  LAD is absent CV: RRR, No M/G/R, cap refill <2 sec PULM: Breathing comfortably in no respiratory distress. no wheezing, crackles, rhonchi   Laboratory and Imaging Data:  Assessment and Plan:     ICD-10-CM   1. Viral URI  J06.9      He is feeling a little bit better today.  I am going to have him continue supportive care, and he is also going to take the day off from work tomorrow.  I gave him a written prescription for Zithromax  in case his symptoms worsen over the  weekend and he can feel at this time.  Medication Management during today's office visit: Meds ordered this encounter  Medications   azithromycin  (ZITHROMAX ) 250 MG tablet    Sig: Take 2 tablets (500 mg total) by mouth daily for 1 day, THEN 1 tablet (250 mg total) daily for 4 days.    Dispense:  6 tablet    Refill:  0   Medications Discontinued During This Encounter  Medication Reason   predniSONE  (STERAPRED UNI-PAK 21 TAB) 10 MG (21) TBPK tablet Completed Course   oxyCODONE  (ROXICODONE ) 5 MG/5ML solution Completed Course    Orders placed today for conditions managed today: No orders of the defined types were placed in this encounter.   Disposition: No follow-ups on file.  [employer] One speech-to-text software was used for transcription in this dictation.  Possible transcriptional errors can occur using Animal nutritionist.   Signed,  Jacques DASEN. Rosezella Kronick, MD   Outpatient Encounter Medications as of 04/16/2023  Medication Sig   azithromycin  (ZITHROMAX ) 250 MG tablet Take 2 tablets (500 mg total) by mouth daily for 1 day, THEN 1 tablet (250 mg total) daily for 4 days.   albuterol  (VENTOLIN  HFA) 108 (90 Base) MCG/ACT inhaler Inhale 2 puffs into the lungs every 6 (six) hours  as needed for wheezing or shortness of breath.   ASHWAGANDHA PO Take by mouth.   Flaxseed, Linseed, (FLAXSEED OIL PO) Take by mouth daily.   fluticasone (FLONASE) 50 MCG/ACT nasal spray Place into both nostrils daily as needed for allergies or rhinitis.   Ginger, Zingiber officinalis, (GINGER PO) Take by mouth daily.   levocetirizine (XYZAL) 5 MG tablet Take 5 mg by mouth every evening.   lidocaine  (XYLOCAINE ) 2 % solution Use as directed 10 mLs in the mouth or throat every 6 (six) hours as needed for mouth pain (Swish and spit).   Multiple Vitamin (MULTIVITAMIN) capsule Take 1 capsule by mouth daily.   ondansetron  (ZOFRAN ) 4 MG tablet Take 1 tablet (4 mg total) by mouth every 8 (eight) hours as needed for  nausea or vomiting.   OVER THE COUNTER MEDICATION daily. Sea moss   [DISCONTINUED] oxyCODONE  (ROXICODONE ) 5 MG/5ML solution Take 5 mLs (5 mg total) by mouth every 4 (four) hours as needed for severe pain.   [DISCONTINUED] predniSONE  (STERAPRED UNI-PAK 21 TAB) 10 MG (21) TBPK tablet Sterapred DS 6 day taper   No facility-administered encounter medications on file as of 04/16/2023.

## 2023-04-16 ENCOUNTER — Encounter: Payer: Self-pay | Admitting: Family Medicine

## 2023-04-16 ENCOUNTER — Ambulatory Visit: Payer: 59 | Admitting: Family Medicine

## 2023-04-16 VITALS — BP 100/70 | HR 73 | Temp 98.0°F | Ht 67.5 in | Wt 187.1 lb

## 2023-04-16 DIAGNOSIS — J069 Acute upper respiratory infection, unspecified: Secondary | ICD-10-CM | POA: Diagnosis not present

## 2023-04-16 MED ORDER — AZITHROMYCIN 250 MG PO TABS: ORAL_TABLET | ORAL | 0 refills | Status: AC

## 2023-06-01 ENCOUNTER — Encounter: Payer: Self-pay | Admitting: Family

## 2023-06-01 ENCOUNTER — Ambulatory Visit (INDEPENDENT_AMBULATORY_CARE_PROVIDER_SITE_OTHER): Payer: 59 | Admitting: Family

## 2023-06-01 VITALS — BP 120/64 | HR 67 | Temp 98.7°F | Ht 69.0 in | Wt 191.0 lb

## 2023-06-01 DIAGNOSIS — J45990 Exercise induced bronchospasm: Secondary | ICD-10-CM | POA: Diagnosis not present

## 2023-06-01 DIAGNOSIS — J301 Allergic rhinitis due to pollen: Secondary | ICD-10-CM

## 2023-06-01 DIAGNOSIS — Z0000 Encounter for general adult medical examination without abnormal findings: Secondary | ICD-10-CM | POA: Insufficient documentation

## 2023-06-01 DIAGNOSIS — J3489 Other specified disorders of nose and nasal sinuses: Secondary | ICD-10-CM | POA: Insufficient documentation

## 2023-06-01 DIAGNOSIS — E7800 Pure hypercholesterolemia, unspecified: Secondary | ICD-10-CM

## 2023-06-01 DIAGNOSIS — R779 Abnormality of plasma protein, unspecified: Secondary | ICD-10-CM

## 2023-06-01 LAB — COMPREHENSIVE METABOLIC PANEL
ALT: 21 U/L (ref 0–53)
AST: 18 U/L (ref 0–37)
Albumin: 4.6 g/dL (ref 3.5–5.2)
Alkaline Phosphatase: 108 U/L (ref 39–117)
BUN: 9 mg/dL (ref 6–23)
CO2: 27 meq/L (ref 19–32)
Calcium: 9.2 mg/dL (ref 8.4–10.5)
Chloride: 104 meq/L (ref 96–112)
Creatinine, Ser: 0.94 mg/dL (ref 0.40–1.50)
GFR: 113.97 mL/min (ref >60.00)
Glucose, Bld: 86 mg/dL (ref 70–99)
Potassium: 3.9 meq/L (ref 3.5–5.1)
Sodium: 138 meq/L (ref 135–145)
Total Bilirubin: 1.1 mg/dL (ref 0.2–1.2)
Total Protein: 7.4 g/dL (ref 6.0–8.3)

## 2023-06-01 LAB — CBC
HCT: 45.1 % (ref 39.0–52.0)
Hemoglobin: 15.1 g/dL (ref 13.0–17.0)
MCHC: 33.5 g/dL (ref 30.0–36.0)
MCV: 88.9 fL (ref 78.0–100.0)
Platelets: 292 10*3/uL (ref 150.0–400.0)
RBC: 5.08 Mil/uL (ref 4.22–5.81)
RDW: 13.5 % (ref 11.5–15.5)
WBC: 5.5 10*3/uL (ref 4.0–10.5)

## 2023-06-01 LAB — LIPID PANEL
Cholesterol: 190 mg/dL (ref 0–200)
HDL: 40.6 mg/dL (ref >39.00)
LDL Cholesterol: 134 mg/dL — ABNORMAL HIGH (ref 0–99)
NonHDL: 148.93
Total CHOL/HDL Ratio: 5
Triglycerides: 75 mg/dL (ref 0.0–149.0)
VLDL: 15 mg/dL (ref 0.0–40.0)

## 2023-06-01 MED ORDER — PREDNISONE 10 MG (21) PO TBPK
ORAL_TABLET | ORAL | 0 refills | Status: AC
Start: 2023-06-01 — End: ?

## 2023-06-01 MED ORDER — ALBUTEROL SULFATE HFA 108 (90 BASE) MCG/ACT IN AERS: 2.0000 | INHALATION_SPRAY | Freq: Four times a day (QID) | RESPIRATORY_TRACT | 0 refills | Status: DC | PRN

## 2023-06-01 NOTE — Assessment & Plan Note (Signed)
Stable  Albuterol prn

## 2023-06-01 NOTE — Progress Notes (Signed)
 Subjective:  Patient ID: Justin Stewart, male    DOB: 05-26-2000  Age: 23 y.o. MRN: [id number]  Patient Care Team: Mort Sawyers, FNP as PCP - General (Family Medicine)   CC:  Chief Complaint  Patient presents with   Facial Pain    Ongoing increases as day goes on   Acute Visit    HPI Justin Stewart is a 23 y.o. male who presents today for an annual physical exam. He reports consuming a general diet. The patient does not participate in regular exercise at present. He generally feels well. He reports sleeping fairly well. He does have additional problems to discuss today.   Vision:Within last year Dental:No current dental problems  Pt is with acute concerns.  C/o sinus pressure, under his eyes and around top of cheeks. Feels it more in the am. With runny nose. He wears glasses which aggravates it. Worse in the last few weeks. No fever. No sore throat. No cough. Restarted xyzal in the last few days. He does have asthma and about one week ago felt some chest tightness, used his albuterol and had improvement. No sob at current.    Advanced Directives Patient does not have advanced directives    DEPRESSION SCREENING    08/01/2022   10:12 AM 07/11/2021   12:40 PM 08/09/2018    3:51 PM  PHQ 2/9 Scores  PHQ - 2 Score 0 0 2  PHQ- 9 Score  4 8     ROS: Negative unless specifically indicated above in HPI.    Current Outpatient Medications:    ASHWAGANDHA PO, Take by mouth., Disp: , Rfl:    Flaxseed, Linseed, (FLAXSEED OIL PO), Take by mouth daily., Disp: , Rfl:    Ginger, Zingiber officinalis, (GINGER PO), Take by mouth daily., Disp: , Rfl:    levocetirizine (XYZAL) 5 MG tablet, Take 5 mg by mouth every evening., Disp: , Rfl:    Multiple Vitamin (MULTIVITAMIN) capsule, Take 1 capsule by mouth daily., Disp: , Rfl:    OVER THE COUNTER MEDICATION, daily. Sea moss, Disp: , Rfl:    predniSONE (STERAPRED UNI-PAK 21 TAB) 10 MG (21) TBPK tablet, Take as directed, Disp: 1 each, Rfl: 0    albuterol (VENTOLIN HFA) 108 (90 Base) MCG/ACT inhaler, Inhale 2 puffs into the lungs every 6 (six) hours as needed for wheezing or shortness of breath., Disp: 8 g, Rfl: 0   fluticasone (FLONASE) 50 MCG/ACT nasal spray, Place into both nostrils daily as needed for allergies or rhinitis. (Patient not taking: Reported on 06/01/2023), Disp: , Rfl:     Objective:    BP 120/64   Pulse 67   Temp 98.7 F (37.1 C) (Oral)   Ht 5\' 9"  (1.753 m)   Wt 191 lb (86.6 kg)   SpO2 97%   BMI 28.21 kg/m   BP Readings from Last 3 Encounters:  06/01/23 120/64  04/16/23 100/70  12/17/22 131/87   SpO2 Readings from Last 3 Encounters:  06/01/23 97%  04/16/23 98%  12/17/22 99%      Physical Exam Vitals reviewed.  Constitutional:      General: He is not in acute distress.    Appearance: Normal appearance. He is normal weight. He is not ill-appearing, toxic-appearing or diaphoretic.  HENT:     Head: Normocephalic.     Right Ear: Tympanic membrane normal.     Left Ear: Tympanic membrane normal.     Nose:     Right Sinus: Frontal sinus tenderness present.  Left Sinus: Frontal sinus tenderness present.     Mouth/Throat:     Mouth: Mucous membranes are moist.  Eyes:     Pupils: Pupils are equal, round, and reactive to light.  Cardiovascular:     Rate and Rhythm: Normal rate and regular rhythm.  Pulmonary:     Effort: Pulmonary effort is normal.     Breath sounds: Normal breath sounds.  Abdominal:     General: Abdomen is flat.     Tenderness: There is no abdominal tenderness.  Musculoskeletal:        General: Normal range of motion.  Skin:    General: Skin is warm.  Neurological:     General: No focal deficit present.     Mental Status: He is alert and oriented to person, place, and time. Mental status is at baseline.  Psychiatric:        Mood and Affect: Mood normal.        Behavior: Behavior normal.        Thought Content: Thought content normal.        Judgment: Judgment normal.           Assessment & Plan:  Sinus pressure Assessment & Plan: Rx prednisone At this time will hold off on antbx think more pressure vs infection   Orders: -     predniSONE; Take as directed  Dispense: 1 each; Refill: 0 -     CBC  Encounter for general adult medical examination without abnormal findings Assessment & Plan: Patient Counseling(The following topics were reviewed):  Preventative care handout given to pt  Health maintenance and immunizations reviewed. Please refer to Health maintenance section. Pt advised on safe sex, wearing seatbelts in car, and proper nutrition labwork ordered today for annual Dental health: Discussed importance of regular tooth brushing, flossing, and dental visits.    Elevated LDL cholesterol level -     Lipid panel  Exercise-induced asthma Assessment & Plan: Stable  Albuterol prn   Orders: -     Albuterol Sulfate HFA; Inhale 2 puffs into the lungs every 6 (six) hours as needed for wheezing or shortness of breath.  Dispense: 8 g; Refill: 0  Elevated blood protein -     Comprehensive metabolic panel  Seasonal allergic rhinitis due to pollen Assessment & Plan: Restart flonase  Continue xyzal     Can not charge as a physical today as last year 2024 physical was in April. Too soon.  Pt is aware of this.    Follow-up: Return in about 1 year (around 05/31/2024) for f/u CPE.   Mort Sawyers, FNP

## 2023-06-01 NOTE — Assessment & Plan Note (Signed)
 Restart flonase  Continue xyzal

## 2023-06-01 NOTE — Assessment & Plan Note (Signed)
 Rx prednisone At this time will hold off on antbx think more pressure vs infection

## 2023-06-01 NOTE — Assessment & Plan Note (Signed)

## 2024-04-29 ENCOUNTER — Other Ambulatory Visit: Payer: Self-pay | Admitting: Family

## 2024-04-29 DIAGNOSIS — J45990 Exercise induced bronchospasm: Secondary | ICD-10-CM

## 2024-05-04 NOTE — Telephone Encounter (Signed)
 Have pt schedule CPE f/u after 05/31/24

## 2024-05-12 NOTE — Telephone Encounter (Signed)
 lvm for pt to call office to schedule appt.
